# Patient Record
Sex: Female | Born: 1975
Health system: Southern US, Community
[De-identification: ages and names within clinical notes are randomized; demographics above are authoritative.]

## PROBLEM LIST (undated history)

## (undated) DIAGNOSIS — N920 Excessive and frequent menstruation with regular cycle: Secondary | ICD-10-CM

## (undated) DIAGNOSIS — E119 Type 2 diabetes mellitus without complications: Secondary | ICD-10-CM

## (undated) HISTORY — PX: TONSILLECTOMY: SUR1361

## (undated) HISTORY — DX: Excessive and frequent menstruation with regular cycle: N92.0

## (undated) HISTORY — PX: TUBAL LIGATION: SHX77

---

## 1998-04-29 ENCOUNTER — Emergency Department (HOSPITAL_COMMUNITY): Admission: EM | Admit: 1998-04-29 | Discharge: 1998-04-29 | Payer: Self-pay | Admitting: Emergency Medicine

## 1998-05-02 ENCOUNTER — Emergency Department (HOSPITAL_COMMUNITY): Admission: EM | Admit: 1998-05-02 | Discharge: 1998-05-03 | Payer: Self-pay | Admitting: Emergency Medicine

## 2001-05-05 ENCOUNTER — Inpatient Hospital Stay (HOSPITAL_COMMUNITY): Admission: AD | Admit: 2001-05-05 | Discharge: 2001-05-05 | Payer: Self-pay | Admitting: Obstetrics & Gynecology

## 2001-05-06 ENCOUNTER — Inpatient Hospital Stay (HOSPITAL_COMMUNITY): Admission: AD | Admit: 2001-05-06 | Discharge: 2001-05-06 | Payer: Self-pay | Admitting: *Deleted

## 2001-05-06 ENCOUNTER — Encounter: Payer: Self-pay | Admitting: *Deleted

## 2001-06-01 ENCOUNTER — Ambulatory Visit (HOSPITAL_COMMUNITY): Admission: RE | Admit: 2001-06-01 | Discharge: 2001-06-01 | Payer: Self-pay | Admitting: *Deleted

## 2001-06-02 ENCOUNTER — Inpatient Hospital Stay (HOSPITAL_COMMUNITY): Admission: AD | Admit: 2001-06-02 | Discharge: 2001-06-02 | Payer: Self-pay | Admitting: Obstetrics & Gynecology

## 2001-09-05 ENCOUNTER — Inpatient Hospital Stay (HOSPITAL_COMMUNITY): Admission: AD | Admit: 2001-09-05 | Discharge: 2001-09-05 | Payer: Self-pay | Admitting: *Deleted

## 2001-10-05 ENCOUNTER — Inpatient Hospital Stay (HOSPITAL_COMMUNITY): Admission: AD | Admit: 2001-10-05 | Discharge: 2001-10-05 | Payer: Self-pay | Admitting: Obstetrics and Gynecology

## 2001-10-12 ENCOUNTER — Inpatient Hospital Stay (HOSPITAL_COMMUNITY): Admission: AD | Admit: 2001-10-12 | Discharge: 2001-10-15 | Payer: Self-pay | Admitting: Obstetrics and Gynecology

## 2001-10-12 ENCOUNTER — Encounter (HOSPITAL_COMMUNITY): Admission: RE | Admit: 2001-10-12 | Discharge: 2001-10-12 | Payer: Self-pay | Admitting: Obstetrics and Gynecology

## 2001-12-09 ENCOUNTER — Encounter: Admission: RE | Admit: 2001-12-09 | Discharge: 2001-12-09 | Payer: Self-pay | Admitting: *Deleted

## 2001-12-19 ENCOUNTER — Ambulatory Visit (HOSPITAL_COMMUNITY): Admission: RE | Admit: 2001-12-19 | Discharge: 2001-12-19 | Payer: Self-pay | Admitting: *Deleted

## 2002-08-04 ENCOUNTER — Encounter: Admission: RE | Admit: 2002-08-04 | Discharge: 2002-08-04 | Payer: Self-pay | Admitting: *Deleted

## 2002-11-21 ENCOUNTER — Emergency Department (HOSPITAL_COMMUNITY): Admission: EM | Admit: 2002-11-21 | Discharge: 2002-11-22 | Payer: Self-pay | Admitting: Emergency Medicine

## 2003-10-03 ENCOUNTER — Emergency Department (HOSPITAL_COMMUNITY): Admission: AD | Admit: 2003-10-03 | Discharge: 2003-10-03 | Payer: Self-pay | Admitting: Family Medicine

## 2003-10-10 ENCOUNTER — Emergency Department (HOSPITAL_COMMUNITY): Admission: EM | Admit: 2003-10-10 | Discharge: 2003-10-10 | Payer: Self-pay | Admitting: Emergency Medicine

## 2003-11-22 ENCOUNTER — Emergency Department (HOSPITAL_COMMUNITY): Admission: EM | Admit: 2003-11-22 | Discharge: 2003-11-22 | Payer: Self-pay | Admitting: Family Medicine

## 2005-02-05 ENCOUNTER — Emergency Department (HOSPITAL_COMMUNITY): Admission: EM | Admit: 2005-02-05 | Discharge: 2005-02-06 | Payer: Self-pay | Admitting: Emergency Medicine

## 2007-02-12 ENCOUNTER — Emergency Department (HOSPITAL_COMMUNITY): Admission: EM | Admit: 2007-02-12 | Discharge: 2007-02-12 | Payer: Self-pay | Admitting: Family Medicine

## 2007-02-21 ENCOUNTER — Emergency Department (HOSPITAL_COMMUNITY): Admission: EM | Admit: 2007-02-21 | Discharge: 2007-02-21 | Payer: Self-pay | Admitting: Emergency Medicine

## 2007-02-24 ENCOUNTER — Emergency Department (HOSPITAL_COMMUNITY): Admission: EM | Admit: 2007-02-24 | Discharge: 2007-02-24 | Payer: Self-pay | Admitting: Emergency Medicine

## 2007-02-27 ENCOUNTER — Emergency Department (HOSPITAL_COMMUNITY): Admission: EM | Admit: 2007-02-27 | Discharge: 2007-02-28 | Payer: Self-pay | Admitting: Emergency Medicine

## 2007-03-03 ENCOUNTER — Emergency Department (HOSPITAL_COMMUNITY): Admission: EM | Admit: 2007-03-03 | Discharge: 2007-03-04 | Payer: Self-pay | Admitting: Emergency Medicine

## 2007-03-07 ENCOUNTER — Emergency Department (HOSPITAL_COMMUNITY): Admission: EM | Admit: 2007-03-07 | Discharge: 2007-03-07 | Payer: Self-pay | Admitting: Emergency Medicine

## 2007-03-09 ENCOUNTER — Emergency Department (HOSPITAL_COMMUNITY): Admission: EM | Admit: 2007-03-09 | Discharge: 2007-03-09 | Payer: Self-pay | Admitting: Emergency Medicine

## 2008-07-16 IMAGING — CR DG CHEST 2V
2 series · 2 of 2 positions shown · non-contrast
Comparison: 10/10/03.

CLINICAL DATA: 31-year-old female with difficulty breathing, cough x 2 days, and dizziness. 
 CHEST ? 2 VIEW:

[w chest pa]
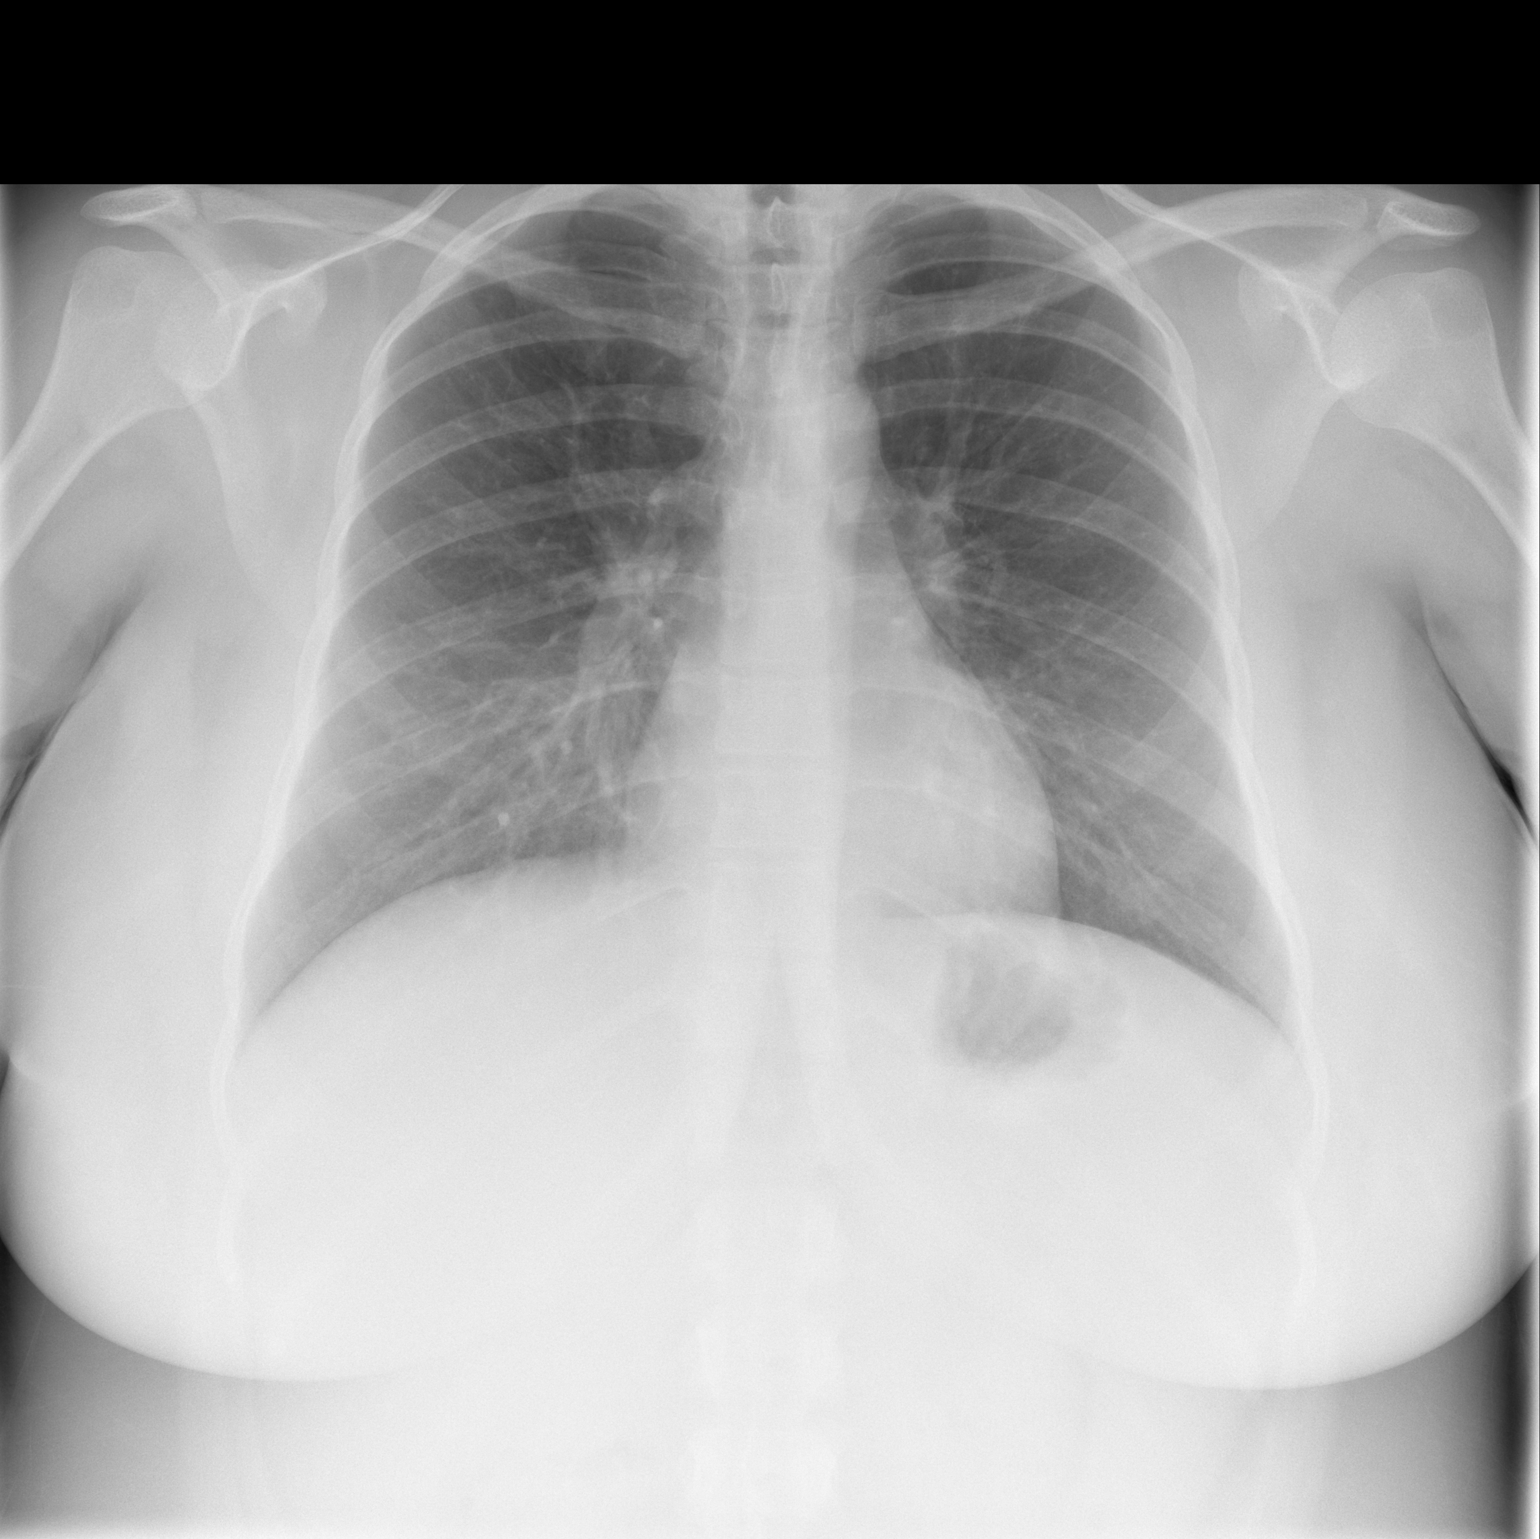

[w chest lat]
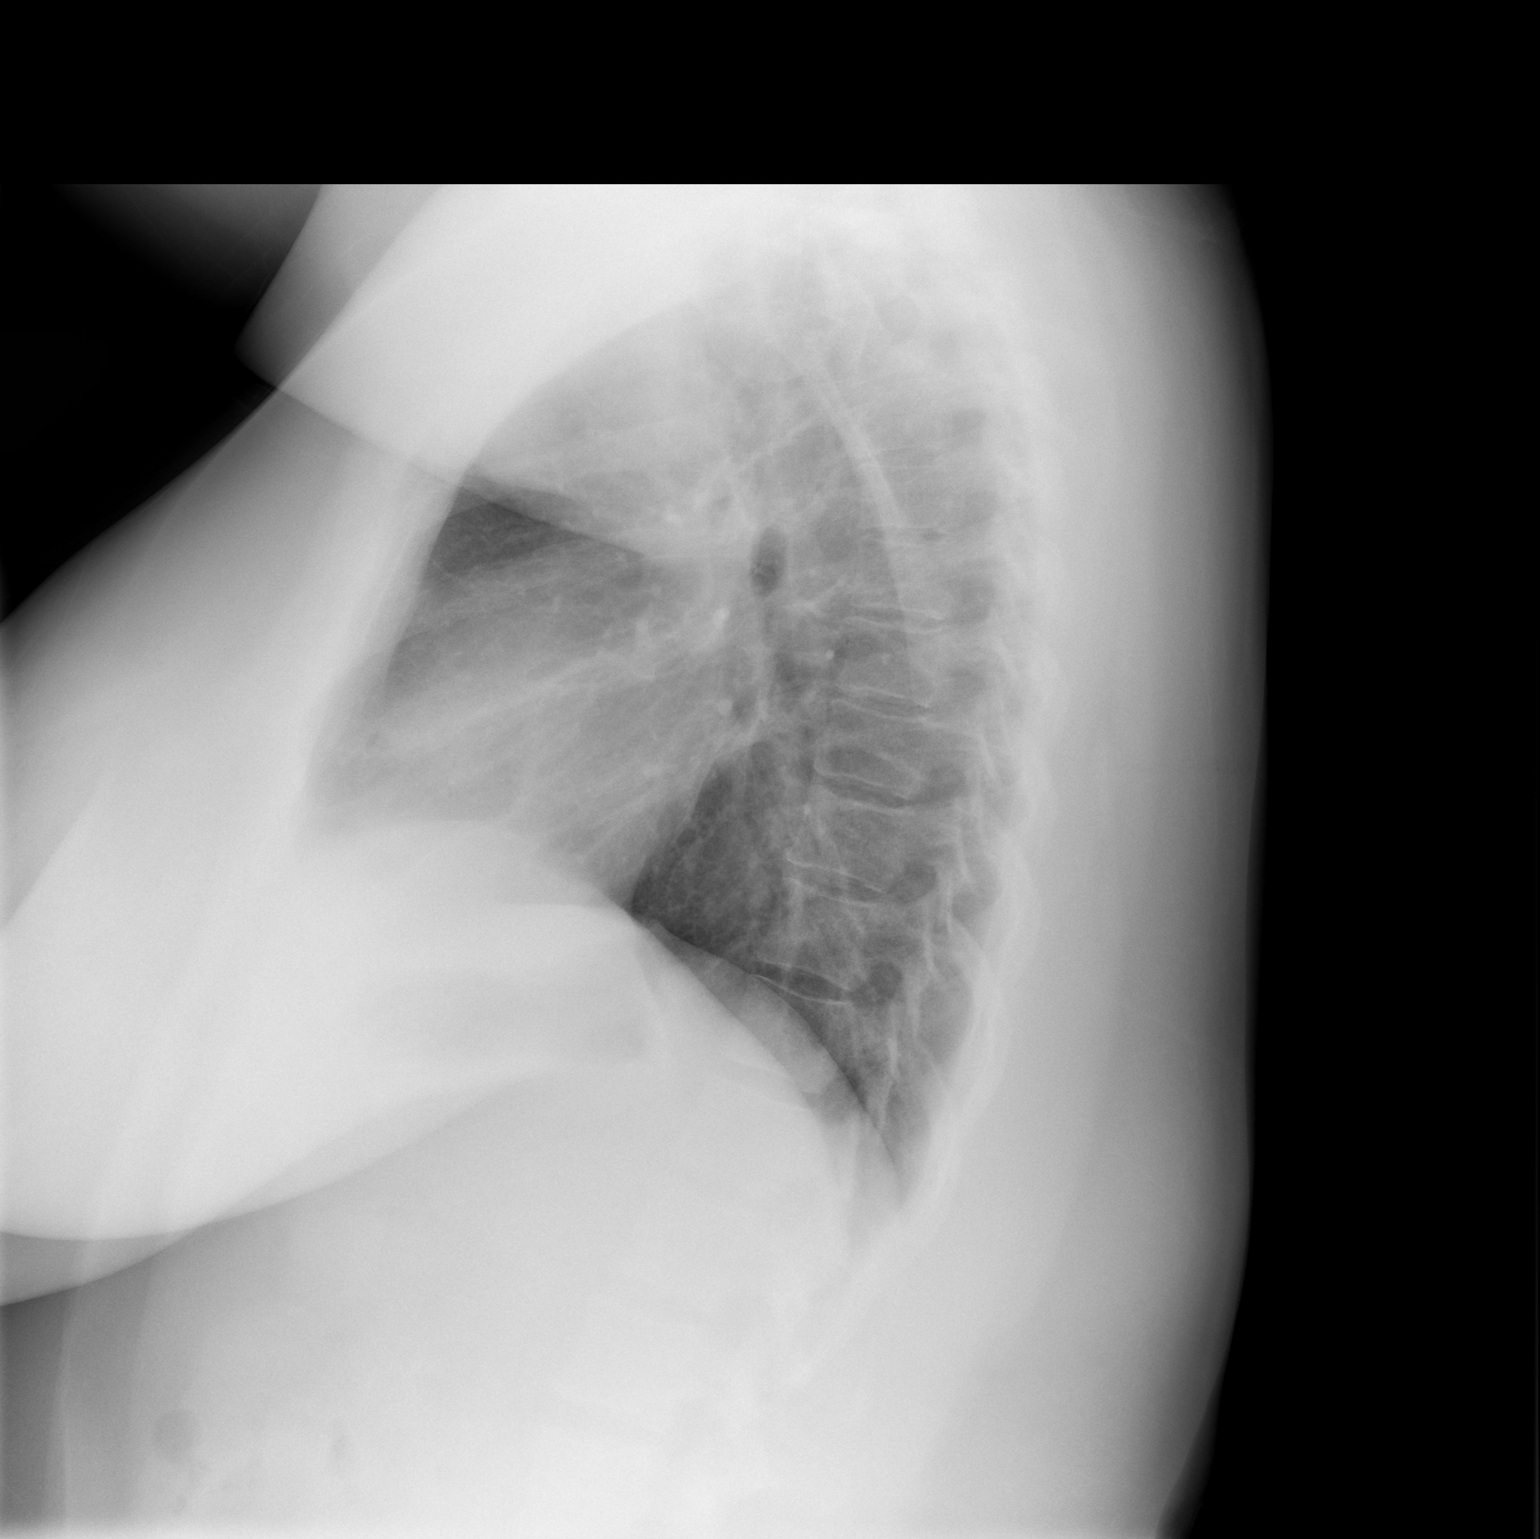

[2 of 2 positions shown; findings below may reference images not displayed]

FINDINGS: Slightly shallower lung inflation with crowding of markings at the lung bases.  Otherwise, the lungs are clear.  No pneumothorax or effusion.  Normal cardiac size, mediastinal contour, and osseous structures.
IMPRESSION: Slightly shallow lung inflation with crowding of lung markings at the bases ? no acute process.

## 2010-07-16 ENCOUNTER — Other Ambulatory Visit
Admission: RE | Admit: 2010-07-16 | Discharge: 2010-07-16 | Payer: Self-pay | Source: Home / Self Care | Admitting: Obstetrics and Gynecology

## 2010-07-16 ENCOUNTER — Ambulatory Visit
Admission: RE | Admit: 2010-07-16 | Discharge: 2010-07-16 | Payer: Self-pay | Source: Home / Self Care | Attending: Obstetrics and Gynecology | Admitting: Obstetrics and Gynecology

## 2010-07-16 LAB — POCT PREGNANCY, URINE: Preg Test, Ur: NEGATIVE

## 2010-07-30 ENCOUNTER — Ambulatory Visit: Admit: 2010-07-30 | Payer: Self-pay | Admitting: Obstetrics & Gynecology

## 2010-08-11 ENCOUNTER — Ambulatory Visit: Admit: 2010-08-11 | Payer: Self-pay | Admitting: Obstetrics & Gynecology

## 2010-08-20 ENCOUNTER — Ambulatory Visit (INDEPENDENT_AMBULATORY_CARE_PROVIDER_SITE_OTHER): Payer: Self-pay | Admitting: Obstetrics and Gynecology

## 2010-08-20 DIAGNOSIS — N949 Unspecified condition associated with female genital organs and menstrual cycle: Secondary | ICD-10-CM

## 2010-09-18 ENCOUNTER — Ambulatory Visit: Payer: Self-pay | Admitting: Obstetrics and Gynecology

## 2010-10-03 NOTE — Progress Notes (Unsigned)
NAME:  Melissa Pratt, Melissa Pratt NO.:  [id number]  MEDICAL RECORD NO.:  [id number]           PATIENT TYPE:  A  LOCATION:  [facility]                   FACILITY:  [facility]  PHYSICIAN:  Argentina Donovan, MD        DATE OF BIRTH:  [birth year]  DATE OF SERVICE:  08/20/2010                                 CLINIC NOTE  The patient is a 35 year old African American female gravida 3, para 3-0- 0-3, who in the beginning of January had an IUD removed.  She had been spotting almost continually with the past several years since the IUD was put in and we switched her to Loestrin birth control pills.  She has been taking that now over a month and is still spotting.  We are going to continue one more month on this.  She is filling out the papers for assistance.  If it continues, we will have someone see her and schedule for an ablation.  I have given her some information on the ablation and answered questions about the procedure.  She had an endometrial biopsy, which was normal.  It did show some endocervical type polyps.  IMPRESSION:  Dysfunctional uterine bleeding of unknown etiology.          ______________________________ Argentina Donovan, MD    PR/MEDQ  D:  08/20/2010  T:  08/21/2010  Job:  [id number]

## 2010-11-28 NOTE — Op Note (Signed)
[facility]  Patient:    Melissa Pratt, Melissa Pratt Visit Number: [id number] MRN: [id number]          Service Type: DSU Location: [facility] Attending Physician:  Enid Cutter Dictated by:   Shearon Balo, M.D. Proc. Date: 12/19/01 Admit Date:  12/19/2001 Discharge Date: 12/19/2001                             Operative Report  PREOPERATIVE DIAGNOSIS:       A 35 year old G3 P3 black female with desired fertility.  POSTOPERATIVE DIAGNOSIS:      A 35 year old G3 P3 black female with desired fertility.  OPERATION:                    Laparoscopic bilateral tubal ligation with Falope ring.  SURGEON:                      Shearon Balo, M.D.  ANESTHESIA:                   General endotracheal.  COMPLICATIONS:                None.  ESTIMATED BLOOD LOSS:         Minimal.  SPECIMENS:                    None.  FINDINGS:                     Normal uterus, tubes, and ovaries, appendix, and liver.  DISPOSITION:                  Recovery room, stable.  DESCRIPTION OF PROCEDURE:     The patient taken to the operating room where she was placed under general endotracheal anesthesia. She was placed in stirrups and prepped and draped in sterile fashion. Acorn cannula was placed in the cervix and attached to the tenaculum at the cervix. The attention was then turned to the abdomen where the umbilicus was injected with 0.25% Marcaine. An incision was made at the umbilicus and a Verres needle was introduced. The abdomen was insufflated to 3.5 L of CO2 gas. A 5 mm trocar was introduced through the incision and a 5 mm scope was used to visualize the pelvic anatomy. A second incision was made suprapubically after injecting Marcaine. A 9 mm trocar was introduced. The uterus, tubes, and ovaries were visualized and appeared to be normal. The patients right fallopian tube was grasped after identifying the fimbriated end. The Falope ring was applied and appropriate knuckle of tube  was obtained with a good slip and blanching of the tube. The patients left fallopian tube was identified in similar fashion. The Falope ring was applied and again, a good knuckle of tube with appropriate slip and blanch were obtained. The tubes were noted to be hemostatic. The suprapubic trocar was removed under direct visualization and the abdomen was approximated and CO2 was evacuated from the abdominal cavity. The umbilical trocar was removed without difficulty. The incisions were then reapproximated with Dermabond. The instruments were removed from the vagina and the patients cervix was noted to have some small oozing and silver nitrate was applied. The patient tolerated the procedure well and returned to the recovery room in stable condition. Dictated by:   Shearon Balo, M.D. Attending Physician:  Enid Cutter DD:  12/19/01 TD:  12/20/01 Job:  [id number]

## 2010-11-28 NOTE — H&P (Signed)
[facility]  Patient:    Melissa Pratt, Melissa Pratt Visit Number: [id number] MRN: [id number]          Service Type: OBS Location: 910A [id number] [id number] Attending Physician:  Amada Kingfisher. Dictated by:   Shearon Balo, M.D. Admit Date:  10/12/2001 Discharge Date: 10/15/2001                           History and Physical  HISTORY OF PRESENT ILLNESS:   The patient is a 35 year old gravida 3 para 3 black female with undesired fertility.  The patient has been counselled extensively regarding the risks of bilateral tubal ligation including bleeding, infection, injury to internal organs, the risk of failure, and the increased risk of ectopic gestation.  In addition, she has been counselled regarding the permanence of this procedure and declines alternative forms of birth control including IUD, hormonal, and barrier contraception.  PAST MEDICAL HISTORY:         None.  PAST SURGICAL HISTORY:        T&A.  PAST OBSTETRICAL HISTORY:     Spontaneous vaginal delivery x3.  PAST GYNECOLOGICAL HISTORY:   No history of sexually transmitted diseases or abnormal Pap smears.  MEDICATIONS:                  None.  ALLERGIES:                    None.  SOCIAL HISTORY:               She denies tobacco, alcohol, or drug use.  FAMILY HISTORY:               Not significant.  PHYSICAL EXAMINATION:  HEENT:                        Normocephalic, atraumatic.  HEART:                        Regular rate and rhythm without murmurs, rubs, or gallops.  LUNGS:                        Clear to auscultation bilaterally.  ABDOMEN:                      Soft, nondistended, no hepatosplenomegaly noted.  EXTREMITIES:                  Within normal limits.  PELVIC:                       Bimanual exam reveals an 8-10 cm retroverted uterus that is nontender and regular in shape.  No adnexal masses noted.  ASSESSMENT AND PLAN:          The patient is a 35 year old G3 P3 black female status  post spontaneous vaginal delivery approximately two months.   The patient desires permanent sterilization.  She will be scheduled for laparoscopy bilateral tubal ligation with Falope rings for December 19, 2001. Dictated by:   Shearon Balo, M.D. Attending Physician:  Amada Kingfisher. DD:  12/09/01 TD:  12/09/01 Job: [id number]

## 2011-04-24 LAB — CBC
HCT: 36.8
HCT: 36.8
Hemoglobin: 12.7
Hemoglobin: 12.7
MCHC: 34.7
MCHC: 34.4
MCV: 85.6
MCV: 85.7
Platelets: 319
Platelets: 319
RBC: 4.29
RBC: 4.3
RDW: 12.7
RDW: 12.5
WBC: 11.1 — ABNORMAL HIGH
WBC: 9.5

## 2011-04-24 LAB — PREGNANCY, URINE: Preg Test, Ur: NEGATIVE

## 2011-04-24 LAB — DIFFERENTIAL
Basophils Absolute: 0
Basophils Absolute: 0
Basophils Relative: 0
Basophils Relative: 0
Eosinophils Absolute: 0.2
Eosinophils Absolute: 0.1
Eosinophils Relative: 2
Eosinophils Relative: 1
Lymphocytes Relative: 29
Lymphocytes Relative: 20
Lymphs Abs: 3.3
Lymphs Abs: 1.9
Monocytes Absolute: 0.5
Monocytes Absolute: 0.5
Monocytes Relative: 4
Monocytes Relative: 6
Neutro Abs: 7.2
Neutro Abs: 7.1
Neutrophils Relative %: 64
Neutrophils Relative %: 74

## 2011-04-24 LAB — I-STAT 8, (EC8 V) (CONVERTED LAB)
Acid-base deficit: 2
BUN: 3 — ABNORMAL LOW
BUN: 6
Bicarbonate: 22.2
Bicarbonate: 23.1
Chloride: 105
Chloride: 106
Glucose, Bld: 108 — ABNORMAL HIGH
Glucose, Bld: 132 — ABNORMAL HIGH
HCT: 39
HCT: 42
Hemoglobin: 13.3
Hemoglobin: 14.3
Operator id: 235561
Operator id: 282201
Potassium: 3.2 — ABNORMAL LOW
Potassium: 3.9
Sodium: 138
Sodium: 139
TCO2: 23
TCO2: 24
pCO2, Ven: 32.7 — ABNORMAL LOW
pCO2, Ven: 33.9 — ABNORMAL LOW
pH, Ven: 7.424 — ABNORMAL HIGH
pH, Ven: 7.458 — ABNORMAL HIGH

## 2011-04-24 LAB — POCT URINALYSIS DIP (DEVICE)
Bilirubin Urine: NEGATIVE
Glucose, UA: NEGATIVE
Hgb urine dipstick: NEGATIVE
Ketones, ur: NEGATIVE
Nitrite: NEGATIVE
Operator id: 235561
Protein, ur: NEGATIVE
Specific Gravity, Urine: <=1.005
Urobilinogen, UA: 0.2
pH: 6

## 2011-04-24 LAB — URINALYSIS, ROUTINE W REFLEX MICROSCOPIC
Bilirubin Urine: NEGATIVE
Bilirubin Urine: NEGATIVE
Glucose, UA: NEGATIVE
Glucose, UA: NEGATIVE
Hgb urine dipstick: NEGATIVE
Hgb urine dipstick: NEGATIVE
Ketones, ur: NEGATIVE
Ketones, ur: NEGATIVE
Nitrite: NEGATIVE
Nitrite: NEGATIVE
Protein, ur: NEGATIVE
Protein, ur: NEGATIVE
Specific Gravity, Urine: 1.002 — ABNORMAL LOW
Specific Gravity, Urine: 1.006
Urobilinogen, UA: 0.2
Urobilinogen, UA: 0.2
pH: 7
pH: 7

## 2011-04-24 LAB — BASIC METABOLIC PANEL
BUN: 3 — ABNORMAL LOW
CO2: 23
Calcium: 9.2
Chloride: 109
Creatinine, Ser: 0.74
GFR calc Af Amer: >60
GFR calc non Af Amer: >60
Glucose, Bld: 111 — ABNORMAL HIGH
Potassium: 3.6
Sodium: 138

## 2011-04-24 LAB — POCT I-STAT CREATININE
Creatinine, Ser: 0.7
Operator id: 282201

## 2011-04-24 LAB — POCT PREGNANCY, URINE
Operator id: 285841
Preg Test, Ur: NEGATIVE

## 2011-05-20 ENCOUNTER — Encounter: Payer: Self-pay | Admitting: *Deleted

## 2011-05-20 ENCOUNTER — Emergency Department (INDEPENDENT_AMBULATORY_CARE_PROVIDER_SITE_OTHER)
Admission: EM | Admit: 2011-05-20 | Discharge: 2011-05-20 | Disposition: A | Discharge status: Home / Self Care | Payer: Self-pay | Source: Home / Self Care | Attending: Family Medicine | Admitting: Family Medicine

## 2011-05-20 DIAGNOSIS — M7650 Patellar tendinitis, unspecified knee: Secondary | ICD-10-CM

## 2011-05-20 MED ORDER — IBUPROFEN 800 MG PO TABS: 800.0000 mg | ORAL_TABLET | Freq: Three times a day (TID) | ORAL | Status: AC

## 2011-05-20 NOTE — ED Provider Notes (Signed)
History     CSN: [id number] Arrival date & time: 05/20/2011  9:17 PM   First MD Initiated Contact with Patient 05/20/11 2120      Chief Complaint  Patient presents with  . Knee Pain    onset of left knee pain yesterday no known injury    (Consider location/radiation/quality/duration/timing/severity/associated sxs/prior treatment) Patient is a 35 y.o. female presenting with knee pain. The history is provided by the patient.  Knee Pain The current episode started yesterday. The problem occurs constantly. The problem has not changed since onset.The symptoms are aggravated by walking (worse with stairs ). The symptoms are relieved by position.    History reviewed. No pertinent past medical history.  History reviewed. No pertinent past surgical history.  History reviewed. No pertinent family history.  History  Substance Use Topics  . Smoking status: Not on file  . Smokeless tobacco: Not on file  . Alcohol Use: Not on file    OB History    Grav Para Term Preterm Abortions TAB SAB Ect Mult Living                  Review of Systems  Constitutional: Negative.   Musculoskeletal: Negative for joint swelling and gait problem.    Allergies  Review of patient's allergies indicates no known allergies.  Home Medications   Current Outpatient Rx  Name Route Sig Dispense Refill  . IBUPROFEN 400 MG PO TABS Oral Take 400 mg by mouth every 6 (six) hours as needed.        BP 123/86  Pulse 80  Temp(Src) 97.6 F (36.4 C) (Oral)  Resp 15  Physical Exam  Constitutional: She appears well-developed and well-nourished.  Musculoskeletal: Normal range of motion. She exhibits tenderness.       Left knee: She exhibits no swelling, no effusion and no bony tenderness. tenderness found. Patellar tendon tenderness noted.  Skin: Skin is warm, dry and intact.       ED Course  Procedures (including critical care time)  Labs Reviewed - No data to display No results found.   No  diagnosis found.    MDM          Barkley Bruns, MD 05/20/11 [phone]

## 2011-10-22 ENCOUNTER — Ambulatory Visit (INDEPENDENT_AMBULATORY_CARE_PROVIDER_SITE_OTHER): Payer: Self-pay | Admitting: Physician Assistant

## 2011-10-22 ENCOUNTER — Encounter: Payer: Self-pay | Admitting: Physician Assistant

## 2011-10-22 VITALS — BP 137/93 | HR 104 | Temp 98.0°F | Ht 63.0 in | Wt 223.2 lb

## 2011-10-22 DIAGNOSIS — N939 Abnormal uterine and vaginal bleeding, unspecified: Secondary | ICD-10-CM

## 2011-10-22 DIAGNOSIS — N938 Other specified abnormal uterine and vaginal bleeding: Secondary | ICD-10-CM | POA: Insufficient documentation

## 2011-10-22 DIAGNOSIS — Z01812 Encounter for preprocedural laboratory examination: Secondary | ICD-10-CM

## 2011-10-22 LAB — POCT PREGNANCY, URINE: Preg Test, Ur: NEGATIVE

## 2011-10-22 MED ORDER — MEGESTROL ACETATE 40 MG PO TABS: 40.0000 mg | ORAL_TABLET | Freq: Every day | ORAL | Status: AC

## 2011-10-22 NOTE — Progress Notes (Signed)
Chief Complaint:  Menorrhagia and Fatigue   Melissa Pratt is  36 y.o. G3P3000.  Patient's last menstrual period was 10/09/2011..  She presents complaining of Menorrhagia and Fatigue . Onset is described as ongoing and has been present for  1 years. Pt reports long standing history of heavy periods. Reports that she was previously treated with and IUD that was in place x 5 years. While in had heavy painful periods lasting 7 days with clots. Her IUD was removed in 2012 by Dr. Okey Dupre with the plan of ablation for long-term management. Pt states that she never returned. Reports that since removal of IUD, she has been having period like bleeding every two weeks lasting 5-7 days. Last bleeding started as spotting on 10/09/11 and progressed to heavy bleeding last Wednesday that required changing a saturated super tampon every 2 hours, less today. Reports changing q 3 hours and tampon "half full". Also reports extremely fatigue progressing over the last 2-3 months. Denies SOB, CP, or dizziness  Obstetrical/Gynecological History: Pertinent Gynecological History: Menses: see HPI Bleeding: dysfunctional uterine bleeding Contraception: none Last mammogram: N/A  Last pap: normal Date: 07/16/2010     Past Medical History: Past Medical History  Diagnosis Date  . Menorrhagia     Past Surgical History: Past Surgical History  Procedure Date  . Tubal ligation     Family History: History reviewed. No pertinent family history.  Social History: History  Substance Use Topics  . Smoking status: Never Smoker   . Smokeless tobacco: Never Used  . Alcohol Use: No    Allergies: No Known Allergies   Review of Systems - Negative except what has been reviewed in the HPI  Physical Exam   Blood pressure 137/93, pulse 104, temperature 98 F (36.7 C), temperature source Oral, height 5\' 3"  (1.6 m), weight 223 lb 3.2 oz (101.243 kg), last menstrual period 10/09/2011.  General: General appearance -  alert, well appearing, and in no distress, oriented to person, place, and time and overweight Mental status - alert, oriented to person, place, and time, normal mood, behavior, speech, dress, motor activity, and thought processes, affect appropriate to mood Abdomen - soft, nontender, nondistended, no masses or organomegaly obese Focused Gynecological Exam: VULVA: normal appearing vulva with no masses, tenderness or lesions, VAGINA: vaginal discharge - small pink, CERVIX: normal appearing cervix without discharge or lesions, UTERUS: enlarged to 12 week's size, ADNEXA: normal adnexa in size, nontender and no masses  Labs: Recent Results (from the past 24 hour(s))  POCT PREGNANCY, URINE   Collection Time   10/22/11  2:38 PM      Component Value Range   Preg Test, Ur NEGATIVE  NEGATIVE    FINAL DIAGNOSIS 2012  1. Endometrium, biopsy, : - PROLIFERATIVE TYPE ENDOMETRIUM. - ENDOCERVICAL TYPE POLYP(S). - THERE IS NO EVIDENCE OF HYPERPLASIA OR MALIGNANCY.     Assessment: 1. Pre-procedure lab exam    2. DUB (dysfunctional uterine bleeding)  CBC, US Pelvis Complete, US Transvaginal Non-OB, megestrol (MEGACE) 40 MG tablet     Plan: FTC in 2-3 weeks for surgical consult probable hystroscopic D&C with ablation.  Carly Applegate E. 10/22/2011,3:31 PM

## 2011-10-22 NOTE — Patient Instructions (Signed)

## 2011-10-22 NOTE — Progress Notes (Signed)
States here because had a heavy period for the first time- it started 10/09/11 and is still going on, at first it was light, usually lasts a week, but this time is still going on, then 09/18/11 got heavy until 09/20/11 and since then is light again. States feels really drained, no energy, just wants to lie down for a couple of days.

## 2011-10-23 LAB — CBC
HCT: 38.7 % (ref 36.0–46.0)
Hemoglobin: 13 g/dL (ref 12.0–15.0)
MCH: 28.8 pg (ref 26.0–34.0)
MCHC: 33.6 g/dL (ref 30.0–36.0)
MCV: 85.8 fL (ref 78.0–100.0)
Platelets: 408 10*3/uL — ABNORMAL HIGH (ref 150–400)
RBC: 4.51 MIL/uL (ref 3.87–5.11)
RDW: 13 % (ref 11.5–15.5)
WBC: 11.2 10*3/uL — ABNORMAL HIGH (ref 4.0–10.5)

## 2011-10-26 ENCOUNTER — Telehealth: Payer: Self-pay | Admitting: *Deleted

## 2011-10-26 ENCOUNTER — Encounter (HOSPITAL_COMMUNITY): Payer: Self-pay

## 2011-10-26 ENCOUNTER — Emergency Department (HOSPITAL_COMMUNITY)
Admission: EM | Admit: 2011-10-26 | Discharge: 2011-10-26 | Disposition: A | Discharge status: Home / Self Care | Payer: Self-pay | Source: Home / Self Care

## 2011-10-26 DIAGNOSIS — R5381 Other malaise: Secondary | ICD-10-CM

## 2011-10-26 DIAGNOSIS — R002 Palpitations: Secondary | ICD-10-CM

## 2011-10-26 DIAGNOSIS — M545 Low back pain: Secondary | ICD-10-CM

## 2011-10-26 DIAGNOSIS — R5383 Other fatigue: Secondary | ICD-10-CM

## 2011-10-26 DIAGNOSIS — K089 Disorder of teeth and supporting structures, unspecified: Secondary | ICD-10-CM

## 2011-10-26 DIAGNOSIS — K0889 Other specified disorders of teeth and supporting structures: Secondary | ICD-10-CM

## 2011-10-26 MED ORDER — METHOCARBAMOL 750 MG PO TABS: 750.0000 mg | ORAL_TABLET | Freq: Four times a day (QID) | ORAL | Status: AC

## 2011-10-26 MED ORDER — IBUPROFEN 800 MG PO TABS: 800.0000 mg | ORAL_TABLET | Freq: Three times a day (TID) | ORAL | Status: DC | PRN

## 2011-10-26 NOTE — Telephone Encounter (Signed)
Pt would like call back in reference to lab work.

## 2011-10-26 NOTE — Telephone Encounter (Signed)
Pt notified of CBC results, will keep her followup on 11/27/11

## 2011-10-26 NOTE — ED Provider Notes (Signed)
History     CSN: [id number]  Arrival date & time 10/26/11  1135   None     Chief Complaint  Patient presents with  . Fatigue    (Consider location/radiation/quality/duration/timing/severity/associated sxs/prior treatment) HPI Comments: Patient presents today with multiple complaints. Her symptoms began when she had a menstrual period that lasted from March 29 to April 13. She was evaluated at [facility] Friday, April 12 and had a negative CBC. She states for the last 5 days she has had fatigue, weakness, and dizziness. The she admits that the symptoms have improved, though not completely resolved, since her vaginal bleeding has stopped. She states for the last 4 days that she has pain intermittently in the back of her head, neck and in her low back. She has also had a toothache for one week. She had palpitations in the evening a couple of nights last week, but has not noticed any in the last few days. No chest pain, or dyspnea. She denies fever, chills, or nasal congestion. She has had a "little cough."  [facility] to get [facility], and she called the [facility] today to schedule an appt but couldn't be seen until next month.   Past Medical History  Diagnosis Date  . Menorrhagia     Past Surgical History  Procedure Date  . Tubal ligation     History reviewed. No pertinent family history.  History  Substance Use Topics  . Smoking status: Never Smoker   . Smokeless tobacco: Never Used  . Alcohol Use: No    OB History    Grav Para Term Preterm Abortions TAB SAB Ect Mult Living   3 3 3              Review of Systems  Constitutional: Positive for fatigue. Negative for fever, chills and appetite change.  HENT: Negative for ear pain, congestion, sore throat and rhinorrhea.   Eyes: Negative for visual disturbance.  Respiratory: Negative for cough and shortness of breath.   Cardiovascular: Positive for palpitations. Negative for chest pain.    Gastrointestinal: Negative for nausea, vomiting and abdominal pain.  Genitourinary: Negative for dysuria, urgency and frequency.    Allergies  Review of patient's allergies indicates no known allergies.  Home Medications   Current Outpatient Rx  Name Route Sig Dispense Refill  . IBUPROFEN 800 MG PO TABS Oral Take 1 tablet (800 mg total) by mouth every 8 (eight) hours as needed. 15 tablet 0  . MEGESTROL ACETATE 40 MG PO TABS Oral Take 1 tablet (40 mg total) by mouth daily. 30 tablet 0  . METHOCARBAMOL 750 MG PO TABS Oral Take 1 tablet (750 mg total) by mouth 4 (four) times daily. 20 tablet 0    BP 127/78  Pulse 87  Temp(Src) 98.3 F (36.8 C) (Oral)  Resp 14  SpO2 98%  LMP 10/09/2011  Physical Exam  Nursing note and vitals reviewed. Constitutional: She appears well-developed and well-nourished. No distress.  HENT:  Head: Normocephalic and atraumatic.  Right Ear: Tympanic membrane, external ear and ear canal normal.  Left Ear: Tympanic membrane, external ear and ear canal normal.  Nose: Nose normal.  Mouth/Throat: Uvula is midline, oropharynx is clear and moist and mucous membranes are normal. Abnormal dentition. No dental abscesses. No oropharyngeal exudate, posterior oropharyngeal edema or posterior oropharyngeal erythema.    Eyes: Conjunctivae, EOM and lids are normal. Pupils are equal, round, and reactive to light.  Neck: Neck supple.  Cardiovascular:  Normal rate, regular rhythm and normal heart sounds.   Pulmonary/Chest: Effort normal and breath sounds normal. No respiratory distress.  Abdominal: Normal appearance and bowel sounds are normal. She exhibits no mass. There is no hepatosplenomegaly. There is no tenderness. There is no CVA tenderness.  Musculoskeletal:       Cervical back: She exhibits tenderness (TTP bilat paraspinal muscles from occiput to posterior shoulders/trapezius) and bony tenderness. She exhibits normal range of motion, no swelling, no deformity and  no spasm.       Lumbar back: She exhibits tenderness (TTP bilat paraspinal muscles). She exhibits normal range of motion, no bony tenderness, no swelling, no deformity and no spasm.  Lymphadenopathy:    She has no cervical adenopathy.  Neurological: She is alert.  Skin: Skin is warm and dry.  Psychiatric: She has a normal mood and affect.    ED Course  Procedures (including critical care time)  Labs Reviewed - No data to display No results found.   1. Low back pain   2. Pain, dental   3. Palpitations   4. Fatigue       MDM  CBC reviewed.  EKG NSR rate 76.          Melody Comas, [place] 10/26/11 1456

## 2011-10-26 NOTE — ED Notes (Signed)
C/o not felt well for 1 week; LMP started 3-29 and cont until 4-13; Wend, started to feel drained, tired; was seen at [facility] on Friday , and was informed the results did not show anything; has another apt done 5-17 to see MD, to have a uterine scraping done ; c/o back pain and HA, wants to be checked out

## 2011-10-26 NOTE — Discharge Instructions (Signed)
Follow up with a dentist regarding your broken tooth.  I am treating your muscular neck and back pain with Ibuprofen and a muscle relaxer.  Return if you have further episodes of palpitations for evaluation. Otherwise follow [facility] with [facility] as [facility]. Follow up with your OB/GYN regarding your menstrual problems.   Back Pain, Adult Back pain is very common. The pain often gets better over time. The cause of back pain is usually not dangerous. Most people can learn to manage their back pain on their own.  HOME CARE   Stay active. Start with short walks on flat ground if you can. Try to walk farther each day.   Do not sit, drive, or stand in one place for more than 30 minutes. Do not stay in bed.   Do not avoid exercise or work. Activity can help your back heal faster.   Be careful when you bend or lift an object. Bend at your knees, keep the object close to you, and do not twist.   Sleep on a firm mattress. Lie on your side, and bend your knees. If you lie on your back, put a pillow under your knees.   Only take medicines as told by your doctor.   Put ice on the injured area.   Put ice in a plastic bag.   Place a towel between your skin and the bag.   Leave the ice on for 15 to 20 minutes, 3 to 4 times a day for the first 2 to 3 days. After that, you can switch between ice and heat packs.   Ask your doctor about back exercises or massage.   Avoid feeling anxious or stressed. Find good ways to deal with stress, such as exercise.  GET HELP RIGHT AWAY IF:   Your pain does not go away with rest or medicine.   Your pain does not go away in 1 week.   You have new problems.   You do not feel well.   The pain spreads into your legs.   You cannot control when you poop (bowel movement) or pee (urinate).   Your arms or legs feel weak or lose feeling (numbness).   You feel sick to your stomach (nauseous) or throw up (vomit).   You have belly (abdominal) pain.     You feel like you may pass out (faint).  MAKE SURE YOU:   Understand these instructions.   Will watch your condition.   Will get help right away if you are not doing well or get worse.  Document Released: 12/16/2007 Document Revised: 06/18/2011 Document Reviewed: 11/17/2010 [facility] Patient Information 2012 [place], [place].

## 2011-10-31 NOTE — ED Provider Notes (Signed)
Medical screening examination/treatment/procedure(s) were performed by resident physician or non-physician practitioner and as supervising physician I was immediately available for consultation/collaboration.   KINDL,JAMES DOUGLAS MD.    James D Kindl, MD 10/31/11 1138 

## 2011-11-27 ENCOUNTER — Encounter: Payer: Self-pay | Admitting: Obstetrics & Gynecology

## 2011-11-27 ENCOUNTER — Ambulatory Visit (INDEPENDENT_AMBULATORY_CARE_PROVIDER_SITE_OTHER): Payer: Self-pay | Admitting: Obstetrics & Gynecology

## 2011-11-27 VITALS — BP 119/80 | HR 79 | Temp 97.9°F | Ht 63.0 in | Wt 219.6 lb

## 2011-11-27 DIAGNOSIS — N938 Other specified abnormal uterine and vaginal bleeding: Secondary | ICD-10-CM

## 2011-11-27 DIAGNOSIS — N926 Irregular menstruation, unspecified: Secondary | ICD-10-CM

## 2011-11-27 NOTE — Progress Notes (Signed)
Patient ID: Melissa Pratt, female   DOB: 05-01-1976, 36 y.o.   MRN: [id number] Patient's last menstrual period was 11/21/2011. Patient has had worsening menorrhagia since her Mirena was removed in January 2012. An endometrial biopsy in January 2012 was benign with some endocervical tie polyps. Dr. Okey Dupre had intended that she schedule and endometrial ablation in 2012 but this has not been performed. She comes today to schedule that procedure. She saw Maylon Cos last month. I explained the procedure and the risk of continued having bleeding. The rate of amenorrhea is approximately 50%. The risk of anesthesia bleeding complications infection were discussed. Her questions were answered. We'll schedule and endometrial ablation with [facility].  Dr. Scheryl Darter 11/27/2011 11:37 AM

## 2011-11-27 NOTE — Patient Instructions (Signed)
Endometrial Ablation Endometrial ablation removes the lining of the uterus (endometrium). It is usually a same day, outpatient treatment. Ablation helps avoid major surgery (such as a hysterectomy). A hysterectomy is removal of the cervix and uterus. Endometrial ablation has less risk and complications, has a shorter recovery period and is less expensive. After endometrial ablation, most women will have little or no menstrual bleeding. You may not keep your fertility. Pregnancy is no longer likely after this procedure but if you are pre-menopausal, you still need to use a reliable method of birth control following the procedure because pregnancy can occur. REASONS TO HAVE THE PROCEDURE MAY INCLUDE:  Heavy periods.   Bleeding that is causing anemia.   Anovulatory bleeding, very irregular, bleeding.   Bleeding submucous fibroids (on the lining inside the uterus) if they are smaller than 3 centimeters.  REASONS NOT TO HAVE THE PROCEDURE MAY INCLUDE:  You wish to have more children.   You have a pre-cancerous or cancerous problem. The cause of any abnormal bleeding must be diagnosed before having the procedure.   You have pain coming from the uterus.   You have a submucus fibroid larger than 3 centimeters.   You recently had a baby.   You recently had an infection in the uterus.   You have a severe retro-flexed, tipped uterus and cannot insert the instrument to do the ablation.   You had a Cesarean section or deep major surgery on the uterus.   The inner cavity of the uterus is too large for the endometrial ablation instrument.  RISKS AND COMPLICATIONS   Perforation of the uterus.   Bleeding.   Infection of the uterus, bladder or vagina.   Injury to surrounding organs.   Cutting the cervix.   An air bubble to the lung (air embolus).   Pregnancy following the procedure.   Failure of the procedure to help the problem requiring hysterectomy.   Decreased ability to diagnose  cancer in the lining of the uterus.  BEFORE THE PROCEDURE  The lining of the uterus must be tested to make sure there is no pre-cancerous or cancer cells present.   Medications may be given to make the lining of the uterus thinner.   Ultrasound may be used to evaluate the size and look for abnormalities of the uterus.   Future pregnancy is not desired.  PROCEDURE  There are different ways to destroy the lining of the uterus.   Resectoscope - radio frequency-alternating electric current is the most common one used.   Cryotherapy - freezing the lining of the uterus.   Heated Free Liquid - heated salt (saline) solution inserted into the uterus.   Microwave - uses high energy microwaves in the uterus.   Thermal Balloon - a catheter with a balloon tip is inserted into the uterus and filled with heated fluid.  Your caregiver will talk with you about the method used in this clinic. They will also instruct you on the pros and cons of the procedure. Endometrial ablation is performed along with a procedure called operative hysteroscopy. A narrow viewing tube is inserted through the birth canal (vagina) and through the cervix into the uterus. A tiny camera attached to the viewing tube (hysteroscope) allows the uterine cavity to be shown on a TV monitor during surgery. Your uterus is filled with a harmless liquid to make the procedure easier. The lining of the uterus is then removed. The lining can also be removed with a resectoscope which allows your surgeon   to cut away the lining of the uterus under direct vision. Usually, you will be able to go home within an hour after the procedure. HOME CARE INSTRUCTIONS   Do not drive for 24 hours.   No tampons, douching or intercourse for 2 weeks or until your caregiver approves.   Rest at home for 24 to 48 hours. You may then resume normal activities unless told differently by your caregiver.   Take your temperature two times a day for 4 days, and record  it.   Take any medications your caregiver has ordered, as directed.   Use some form of contraception if you are pre-menopausal and do not want to get pregnant.  Bleeding after the procedure is normal. It varies from light spotting and mildly watery to bloody discharge for 4 to 6 weeks. You may also have mild cramping. Only take over-the-counter or prescription medicines for pain, discomfort, or fever as directed by your caregiver. Do not use aspirin, as this may aggravate bleeding. Frequent urination during the first 24 hours is normal. You will not know how effective your surgery is until at least 3 months after the surgery. SEEK IMMEDIATE MEDICAL CARE IF:   Bleeding is heavier than a normal menstrual cycle.   An oral temperature above 102 F (38.9 C) develops.   You have increasing cramps or pains not relieved with medication or develop belly (abdominal) pain which does not seem to be related to the same area of earlier cramping and pain.   You are light headed, weak or have fainting episodes.   You develop pain in the shoulder strap areas.   You have chest or leg pain.   You have abnormal vaginal discharge.   You have painful urination.  Document Released: 05/08/2004 Document Revised: 06/18/2011 Document Reviewed: 08/06/2007 [employer] Patient Information 2012 [employer]. 

## 2011-12-14 ENCOUNTER — Encounter (HOSPITAL_COMMUNITY): Payer: Self-pay | Admitting: *Deleted

## 2011-12-16 ENCOUNTER — Telehealth: Payer: Self-pay

## 2011-12-16 NOTE — Telephone Encounter (Signed)
Called pt and pt stated that she had fibroids and is having pain.  Pt stated is this the reason why she is having pain due to the fibroids.  I advised pt that yes due to her having fibroids it could cause abdominal pain and the reasoning for her heavy vaginal bleeding but that is also the reason for the ablation to help with the heavy bleeding.  Pt stated understanding and had no further questions.

## 2011-12-16 NOTE — Telephone Encounter (Signed)
Pt called and stated that she has surgery scheduled for 01/04/12 and that she went to the [facility] where they told her she had fibroids.  Can someone call back please?

## 2011-12-21 ENCOUNTER — Encounter (HOSPITAL_COMMUNITY): Payer: Self-pay | Admitting: Pharmacist

## 2012-01-04 ENCOUNTER — Encounter (HOSPITAL_COMMUNITY)
Admission: RE | Disposition: A | Discharge status: Home / Self Care | Payer: Self-pay | Source: Ambulatory Visit | Attending: Obstetrics & Gynecology

## 2012-01-04 ENCOUNTER — Encounter (HOSPITAL_COMMUNITY): Payer: Self-pay | Admitting: Anesthesiology

## 2012-01-04 ENCOUNTER — Ambulatory Visit (HOSPITAL_COMMUNITY)
Admission: RE | Admit: 2012-01-04 | Discharge: 2012-01-04 | Disposition: A | Discharge status: Home / Self Care | Payer: Medicaid Other | Source: Ambulatory Visit | Attending: Obstetrics & Gynecology | Admitting: Obstetrics & Gynecology

## 2012-01-04 ENCOUNTER — Other Ambulatory Visit: Payer: Self-pay | Admitting: Obstetrics & Gynecology

## 2012-01-04 ENCOUNTER — Ambulatory Visit (HOSPITAL_COMMUNITY): Payer: Medicaid Other | Admitting: Anesthesiology

## 2012-01-04 ENCOUNTER — Encounter (HOSPITAL_COMMUNITY): Payer: Self-pay | Admitting: *Deleted

## 2012-01-04 DIAGNOSIS — N924 Excessive bleeding in the premenopausal period: Secondary | ICD-10-CM

## 2012-01-04 DIAGNOSIS — N949 Unspecified condition associated with female genital organs and menstrual cycle: Secondary | ICD-10-CM | POA: Insufficient documentation

## 2012-01-04 DIAGNOSIS — N938 Other specified abnormal uterine and vaginal bleeding: Secondary | ICD-10-CM | POA: Insufficient documentation

## 2012-01-04 HISTORY — PX: NOVASURE ABLATION: SHX5394

## 2012-01-04 LAB — CBC
HCT: 37 % (ref 36.0–46.0)
Hemoglobin: 12.6 g/dL (ref 12.0–15.0)
MCH: 28.5 pg (ref 26.0–34.0)
MCV: 83.7 fL (ref 78.0–100.0)
RBC: 4.42 MIL/uL (ref 3.87–5.11)
WBC: 7.9 10*3/uL (ref 4.0–10.5)

## 2012-01-04 SURGERY — NOVASURE ABLATION
Anesthesia: General | Site: Vagina | Wound class: Clean Contaminated

## 2012-01-04 MED ORDER — PROPOFOL 10 MG/ML IV EMUL
INTRAVENOUS | Status: AC
  Filled 2012-01-04: qty 20

## 2012-01-04 MED ORDER — ONDANSETRON HCL 4 MG/2ML IJ SOLN: 4.0000 mg | Freq: Once | INTRAMUSCULAR | Status: DC | PRN

## 2012-01-04 MED ORDER — KETOROLAC TROMETHAMINE 30 MG/ML IJ SOLN
INTRAMUSCULAR | Status: AC
  Filled 2012-01-04: qty 1

## 2012-01-04 MED ORDER — LACTATED RINGERS IV SOLN
INTRAVENOUS | Status: DC
  Administered 2012-01-04: 10:00:00 via INTRAVENOUS

## 2012-01-04 MED ORDER — FENTANYL CITRATE 0.05 MG/ML IJ SOLN
INTRAMUSCULAR | Status: AC
  Filled 2012-01-04: qty 2

## 2012-01-04 MED ORDER — BUPIVACAINE-EPINEPHRINE 0.5% -1:200000 IJ SOLN
INTRAMUSCULAR | Status: DC | PRN
  Administered 2012-01-04: 6 mL

## 2012-01-04 MED ORDER — MIDAZOLAM HCL 2 MG/2ML IJ SOLN
INTRAMUSCULAR | Status: AC
  Filled 2012-01-04: qty 2

## 2012-01-04 MED ORDER — MEPERIDINE HCL 25 MG/ML IJ SOLN: 6.2500 mg | INTRAMUSCULAR | Status: DC | PRN

## 2012-01-04 MED ORDER — ONDANSETRON HCL 4 MG/2ML IJ SOLN
INTRAMUSCULAR | Status: DC | PRN
  Administered 2012-01-04: 4 mg via INTRAVENOUS

## 2012-01-04 MED ORDER — ONDANSETRON HCL 4 MG/2ML IJ SOLN
INTRAMUSCULAR | Status: AC
  Filled 2012-01-04: qty 2

## 2012-01-04 MED ORDER — PROPOFOL 10 MG/ML IV EMUL
INTRAVENOUS | Status: DC | PRN
  Administered 2012-01-04: 160 mg via INTRAVENOUS

## 2012-01-04 MED ORDER — MIDAZOLAM HCL 5 MG/5ML IJ SOLN
INTRAMUSCULAR | Status: DC | PRN
  Administered 2012-01-04: 2 mg via INTRAVENOUS

## 2012-01-04 MED ORDER — FENTANYL CITRATE 0.05 MG/ML IJ SOLN
INTRAMUSCULAR | Status: DC | PRN
  Administered 2012-01-04: 2 ug via INTRAVENOUS

## 2012-01-04 MED ORDER — LIDOCAINE HCL (CARDIAC) 20 MG/ML IV SOLN
INTRAVENOUS | Status: DC | PRN
  Administered 2012-01-04: 60 mg via INTRAVENOUS

## 2012-01-04 MED ORDER — FENTANYL CITRATE 0.05 MG/ML IJ SOLN: 25.0000 ug | INTRAMUSCULAR | Status: DC | PRN

## 2012-01-04 MED ORDER — DEXAMETHASONE SODIUM PHOSPHATE 10 MG/ML IJ SOLN
INTRAMUSCULAR | Status: AC
  Filled 2012-01-04: qty 1

## 2012-01-04 MED ORDER — KETOROLAC TROMETHAMINE 30 MG/ML IJ SOLN: 15.0000 mg | Freq: Once | INTRAMUSCULAR | Status: DC | PRN

## 2012-01-04 MED ORDER — BUPIVACAINE-EPINEPHRINE (PF) 0.5% -1:200000 IJ SOLN
INTRAMUSCULAR | Status: AC
  Filled 2012-01-04: qty 10

## 2012-01-04 MED ORDER — HYDROCODONE-ACETAMINOPHEN 5-500 MG PO TABS: 1.0000 | ORAL_TABLET | Freq: Four times a day (QID) | ORAL | Status: AC | PRN

## 2012-01-04 MED ORDER — KETOROLAC TROMETHAMINE 30 MG/ML IJ SOLN
INTRAMUSCULAR | Status: DC | PRN
  Administered 2012-01-04: 30 mg via INTRAVENOUS

## 2012-01-04 MED ORDER — LIDOCAINE HCL (CARDIAC) 20 MG/ML IV SOLN
INTRAVENOUS | Status: AC
  Filled 2012-01-04: qty 5

## 2012-01-04 SURGICAL SUPPLY — 11 items
ABLATOR ENDOMETRIAL BIPOLAR (ABLATOR) ×2 IMPLANT
CATH ROBINSON RED A/P 16FR (CATHETERS) ×2 IMPLANT
CLOTH BEACON ORANGE TIMEOUT ST (SAFETY) ×2 IMPLANT
CONTAINER PREFILL 10% NBF 60ML (FORM) IMPLANT
GLOVE BIO SURGEON STRL SZ 6.5 (GLOVE) ×2 IMPLANT
GLOVE BIOGEL PI IND STRL 7.0 (GLOVE) ×2 IMPLANT
GLOVE BIOGEL PI INDICATOR 7.0 (GLOVE) ×2
GOWN PREVENTION PLUS LG XLONG (DISPOSABLE) ×4 IMPLANT
PACK VAGINAL MINOR WOMEN LF (CUSTOM PROCEDURE TRAY) ×2 IMPLANT
TOWEL OR 17X24 6PK STRL BLUE (TOWEL DISPOSABLE) ×4 IMPLANT
WATER STERILE IRR 1000ML POUR (IV SOLUTION) ×2 IMPLANT

## 2012-01-04 NOTE — Anesthesia Preprocedure Evaluation (Signed)
Anesthesia Evaluation  Patient identified by MRN, date of birth, ID band Patient awake    Reviewed: Allergy & Precautions, H&P , NPO status , Patient's Chart, lab work & pertinent test results  Airway Mallampati: II TM Distance: >3 FB Neck ROM: full    Dental No notable dental hx. (+) Teeth Intact   Pulmonary neg pulmonary ROS,    Pulmonary exam normal       Cardiovascular negative cardio ROS      Neuro/Psych negative neurological ROS  negative psych ROS   GI/Hepatic negative GI ROS, Neg liver ROS,   Endo/Other  Morbid obesity  Renal/GU negative Renal ROS  negative genitourinary   Musculoskeletal negative musculoskeletal ROS (+)   Abdominal (+) + obese,   Peds negative pediatric ROS (+)  Hematology negative hematology ROS (+)   Anesthesia Other Findings   Reproductive/Obstetrics negative OB ROS                           Anesthesia Physical Anesthesia Plan  ASA: III  Anesthesia Plan: General   Post-op Pain Management:    Induction: Intravenous  Airway Management Planned: LMA  Additional Equipment:   Intra-op Plan:   Post-operative Plan:   Informed Consent: I have reviewed the patients History and Physical, chart, labs and discussed the procedure including the risks, benefits and alternatives for the proposed anesthesia with the patient or authorized representative who has indicated his/her understanding and acceptance.     Plan Discussed with: CRNA and Surgeon  Anesthesia Plan Comments:         Anesthesia Quick Evaluation

## 2012-01-04 NOTE — Transfer of Care (Signed)
Immediate Anesthesia Transfer of Care Note  Patient: Melissa Pratt  Procedure(s) Performed: Procedure(s) (LRB): NOVASURE ABLATION (N/A)  Patient Location: PACU  Anesthesia Type: General  Level of Consciousness: awake, alert  and oriented  Airway & Oxygen Therapy: Patient Spontanous Breathing and Patient connected to nasal cannula oxygen  Post-op Assessment: Report given to PACU RN and Post -op Vital signs reviewed and stable  Post vital signs: Reviewed and stable  Complications: No apparent anesthesia complications

## 2012-01-04 NOTE — Anesthesia Postprocedure Evaluation (Signed)
Anesthesia Post Note  Patient: Melissa Pratt  Procedure(s) Performed: Procedure(s) (LRB): NOVASURE ABLATION (N/A)  Anesthesia type: General  Patient location: PACU  Post pain: Pain level controlled  Post assessment: Post-op Vital signs reviewed  Last Vitals:  Filed Vitals:   01/04/12 1200  BP: 111/61  Pulse: 81  Temp:   Resp: 20    Post vital signs: Reviewed  Level of consciousness: sedated  Complications: No apparent anesthesia complicationsfj

## 2012-01-04 NOTE — Discharge Instructions (Signed)
Endometrial Ablation Endometrial ablation removes the lining of the uterus (endometrium). It is usually a same day, outpatient treatment. Ablation helps avoid major surgery (such as a hysterectomy). A hysterectomy is removal of the cervix and uterus. Endometrial ablation has less risk and complications, has a shorter recovery period and is less expensive. After endometrial ablation, most women will have little or no menstrual bleeding. You may not keep your fertility. Pregnancy is no longer likely after this procedure but if you are pre-menopausal, you still need to use a reliable method of birth control following the procedure because pregnancy can occur. REASONS TO HAVE THE PROCEDURE MAY INCLUDE:  Heavy periods.   Bleeding that is causing anemia.   Anovulatory bleeding, very irregular, bleeding.   Bleeding submucous fibroids (on the lining inside the uterus) if they are smaller than 3 centimeters.  REASONS NOT TO HAVE THE PROCEDURE MAY INCLUDE:  You wish to have more children.   You have a pre-cancerous or cancerous problem. The cause of any abnormal bleeding must be diagnosed before having the procedure.   You have pain coming from the uterus.   You have a submucus fibroid larger than 3 centimeters.   You recently had a baby.   You recently had an infection in the uterus.   You have a severe retro-flexed, tipped uterus and cannot insert the instrument to do the ablation.   You had a Cesarean section or deep major surgery on the uterus.   The inner cavity of the uterus is too large for the endometrial ablation instrument.  RISKS AND COMPLICATIONS   Perforation of the uterus.   Bleeding.   Infection of the uterus, bladder or vagina.   Injury to surrounding organs.   Cutting the cervix.   An air bubble to the lung (air embolus).   Pregnancy following the procedure.   Failure of the procedure to help the problem requiring hysterectomy.   Decreased ability to diagnose  cancer in the lining of the uterus.  BEFORE THE PROCEDURE  The lining of the uterus must be tested to make sure there is no pre-cancerous or cancer cells present.   Medications may be given to make the lining of the uterus thinner.   Ultrasound may be used to evaluate the size and look for abnormalities of the uterus.   Future pregnancy is not desired.  PROCEDURE  There are different ways to destroy the lining of the uterus.   Resectoscope - radio frequency-alternating electric current is the most common one used.   Cryotherapy - freezing the lining of the uterus.   Heated Free Liquid - heated salt (saline) solution inserted into the uterus.   Microwave - uses high energy microwaves in the uterus.   Thermal Balloon - a catheter with a balloon tip is inserted into the uterus and filled with heated fluid.  Your caregiver will talk with you about the method used in this clinic. They will also instruct you on the pros and cons of the procedure. Endometrial ablation is performed along with a procedure called operative hysteroscopy. A narrow viewing tube is inserted through the birth canal (vagina) and through the cervix into the uterus. A tiny camera attached to the viewing tube (hysteroscope) allows the uterine cavity to be shown on a TV monitor during surgery. Your uterus is filled with a harmless liquid to make the procedure easier. The lining of the uterus is then removed. The lining can also be removed with a resectoscope which allows your surgeon   to cut away the lining of the uterus under direct vision. Usually, you will be able to go home within an hour after the procedure. HOME CARE INSTRUCTIONS   Do not drive for 24 hours.   No tampons, douching or intercourse for 2 weeks or until your caregiver approves.   Rest at home for 24 to 48 hours. You may then resume normal activities unless told differently by your caregiver.   Take your temperature two times a day for 4 days, and record  it.   Take any medications your caregiver has ordered, as directed.   Use some form of contraception if you are pre-menopausal and do not want to get pregnant.  Bleeding after the procedure is normal. It varies from light spotting and mildly watery to bloody discharge for 4 to 6 weeks. You may also have mild cramping. Only take over-the-counter or prescription medicines for pain, discomfort, or fever as directed by your caregiver. Do not use aspirin, as this may aggravate bleeding. Frequent urination during the first 24 hours is normal. You will not know how effective your surgery is until at least 3 months after the surgery. SEEK IMMEDIATE MEDICAL CARE IF:   Bleeding is heavier than a normal menstrual cycle.   An oral temperature above 102 F (38.9 C) develops.   You have increasing cramps or pains not relieved with medication or develop belly (abdominal) pain which does not seem to be related to the same area of earlier cramping and pain.   You are light headed, weak or have fainting episodes.   You develop pain in the shoulder strap areas.   You have chest or leg pain.   You have abnormal vaginal discharge.   You have painful urination.  Document Released: 05/08/2004 Document Revised: 06/18/2011 Document Reviewed: 08/06/2007 [employer] Patient Information 2012 [employer]. 

## 2012-01-04 NOTE — H&P (Addendum)
Melissa Pratt is an 36 y.o. female. LMP 12/26/11. She presents today for endometrial ablation with Novasure. She has heavy irregular menses, and a benign endometrial biopsy from 2012. The procedure and the risks of continued bleeding problems, amenorrhea, pain, infection, pelvic organ damage were discussed and her questions were answered.   Pertinent Gynecological History: Menses: flow is moderate to heavy Bleeding: dysfunctional uterine bleeding Contraception: tubal ligation DES exposure: denies Blood transfusions: none Sexually transmitted diseases: no past history Previous GYN Procedures:   Last mammogram: na Date:   Last pap: normal Date:  OB History: G3, P3   Menstrual History: Menarche age:      Past Medical History  Diagnosis Date  . Menorrhagia   . SVD (spontaneous vaginal delivery)     x 3    Past Surgical History  Procedure Date  . Tubal ligation   . Tonsillectomy     History reviewed. No pertinent family history.  Social History:  reports that she has never smoked. She has never used smokeless tobacco. She reports that she does not drink alcohol or use illicit drugs.  Allergies: No Known Allergies  No prescriptions prior to admission    ROS  Blood pressure 130/93, pulse 98, temperature 97.7 F (36.5 C), resp. rate 18, height 5\' 3"  (1.6 m), weight 99.338 kg (219 lb), last menstrual period 12/05/2011, SpO2 99.00%. Physical Exam  Constitutional: She is oriented to person, place, and time. She appears well-developed and well-nourished. No distress.  Cardiovascular: Normal rate and regular rhythm.   Respiratory: Effort normal and breath sounds normal.  GI: Soft. She exhibits no mass. There is no tenderness.  Neurological: She is alert and oriented to person, place, and time.  Skin: Skin is warm and dry.  Psychiatric: She has a normal mood and affect. Her behavior is normal.    No results found for this or any previous visit (from the past 24  hour(s)).  No results found.  Assessment/Plan: DUB, endometrial ablation by Novasure.  Docie Abramovich 01/04/2012, 10:02 AM

## 2012-01-04 NOTE — Op Note (Signed)
Procedure: Endometrial ablation with NovaSure Preoperative diagnosis: Dysfunctional uterine bleeding Postoperative diagnosis: Same Surgeon: Dr. Scheryl Darter Anesthesia: Gen., local intracervical block Specimen: None Complications: None Drains: None Counts: Correct   Patient gave written consent for endometrial ablation with NovaSure for dysfunctional uterine bleeding. Patient identification was confirmed and she was brought to the OR and general anesthesia was induced. She's placed in dorsal lithotomy position. The perineum and vagina were sterilely prepped and draped. The bladder was drained with a red rubber catheter. Exam under anesthesia revealed normal-size uterus no adnexal masses. Speculum was inserted and half percent Marcaine with 1 200,000 epinephrine was infiltrated as an intracervical block. 6 mL was used. Cervix was grasped with a single-tooth tenaculum. Uterus sounded to 8 cm. The cervix was dilated up to a 8 mm Hagar dilator. Cervical length was 4 cm. Uterine cavity length was 4 cm. The NovaSure device was inserted and deployed and the cavity width of 3.2 cm was obtained. Endometrial ablation was performed procedure lasting 2 minutes. All instruments were removed. Patient tolerated the procedure well without complications. She was brought in stable condition to the PACU.  Dr. Scheryl Darter 01/04/2012 11:55 AM

## 2012-01-05 ENCOUNTER — Encounter (HOSPITAL_COMMUNITY): Payer: Self-pay | Admitting: Obstetrics & Gynecology

## 2012-05-04 ENCOUNTER — Encounter (HOSPITAL_COMMUNITY): Payer: Self-pay | Admitting: Emergency Medicine

## 2012-05-04 ENCOUNTER — Emergency Department (HOSPITAL_COMMUNITY)
Admission: EM | Admit: 2012-05-04 | Discharge: 2012-05-04 | Disposition: A | Discharge status: Home / Self Care | Payer: Medicaid Other | Source: Home / Self Care | Attending: Emergency Medicine | Admitting: Emergency Medicine

## 2012-05-04 DIAGNOSIS — M654 Radial styloid tenosynovitis [de Quervain]: Secondary | ICD-10-CM

## 2012-05-04 DIAGNOSIS — L609 Nail disorder, unspecified: Secondary | ICD-10-CM

## 2012-05-04 MED ORDER — MELOXICAM 7.5 MG PO TABS: 7.5000 mg | ORAL_TABLET | Freq: Every day | ORAL | Status: AC

## 2012-05-04 NOTE — ED Provider Notes (Signed)
History     CSN: [id number]  Arrival date & time 05/04/12  1003   First MD Initiated Contact with Patient 05/04/12 1016      Chief Complaint  Patient presents with  . Hand Pain    (Consider location/radiation/quality/duration/timing/severity/associated sxs/prior treatment) HPI Comments: Patient presents to urgent care today complaining of pain in the dorsal aspect of her right wrist mainly in the area of her thumb. 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 She is right-handed. She denies any weakness, numbness or tingling sensation in the other fingers are affected hand. Denies any direct trauma or injury to her head. Patient also denies any constitutional symptoms such as fevers, myalgias arthralgias or unintentional weight loss.   patient also reports that her nails look abnormal for several months she admits that she has been using his artificial tears for several years but has stopped this over the last few months. Her nails look somewhat deformed and darker in the middle. Denies any tenderness, erythema, swelling or brittle nails.  Patient is a 36 y.o. female presenting with hand pain. The history is provided by the patient.  Hand Pain This is a new problem. The current episode started more than 2 days ago. The problem occurs constantly. The problem has been gradually worsening. Exacerbated by: Moving wrist and palpation.    Past Medical History  Diagnosis Date  . Menorrhagia   . SVD (spontaneous vaginal delivery)     x 3    Past Surgical History  Procedure Date  . Tubal ligation   . Tonsillectomy   . Novasure ablation 01/04/2012    Procedure: NOVASURE ABLATION;  Surgeon: Melissa Phenix, MD;  Location: [facility];  Service: Gynecology;  Laterality: N/A;    No family history on file.  History  Substance Use Topics  . Smoking status: Never Smoker   . Smokeless tobacco: Never Used  . Alcohol Use: No    OB History      Grav Para Term Preterm Abortions TAB SAB Ect Mult Living   3 3 3       3       Review of Systems  Constitutional: Negative for fever, appetite change and unexpected weight change.  Musculoskeletal: Negative for myalgias, back pain, joint swelling and arthralgias.  Skin: Negative for color change, pallor, rash and wound.    Allergies  Review of patient's allergies indicates no known allergies.  Home Medications   Current Outpatient Rx  Name Route Sig Dispense Refill  . MELOXICAM 7.5 MG PO TABS Oral Take 1 tablet (7.5 mg total) by mouth daily. 14 tablet 0    BP 127/90  Pulse 69  Temp 98.2 F (36.8 C) (Oral)  Resp 24  SpO2 99%  Physical Exam  Nursing note and vitals reviewed. Constitutional: She appears well-developed and well-nourished.  Musculoskeletal: She exhibits tenderness.       Hands: Neurological: She is alert. She exhibits normal muscle tone. Coordination normal.  Skin: Skin is warm. No rash noted. No erythema.    ED Course  Procedures (including critical care time)  Labs Reviewed - No data to display No results found.   1. De Quervain's tenosynovitis, right   2. Nail abnormality       MDM  Problem #1 wrist pain ,Patient reporting occupational activity of repetitive recurrent hand movements. Presents with tenderness suggestive anatomically elevate her veins tendinitis. No direct injury or trauma to her  hand. X-rays were not performed today based on exam and history. Have encouraged patient to use a thumb spica for 5-7 days for comfort and to pursue a anti-inflammatory course with meloxicam for 2 weeks. If discomfort persists beyond this timeframe was recommended to followup with a hand orthopedic Dr. further treatment and management. She agrees and we have provided a working note with restrictions for 5-7 days.  Problem #2 nail abnormalities. Patient was a recurrent user of artificial manicure/nails. I presume this changes or not secondary to unequal  mycosis but more a result of the artificial nail chronic usage. Have advised patient to wait at least 8-9 months and if no improvement or worsening to followup with the dermatologist to do a nail culture describing to rule out onychomycosis. She agrees with treatment plan conservative observation and followup as necessary.        Jimmie Molly, MD 05/04/12 1245

## 2012-05-04 NOTE — ED Notes (Signed)
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  Pain and swelling in right hand, particularly around thumb.  Patient is right handed

## 2012-12-01 ENCOUNTER — Emergency Department (INDEPENDENT_AMBULATORY_CARE_PROVIDER_SITE_OTHER): Payer: Medicaid Other

## 2012-12-01 ENCOUNTER — Encounter (HOSPITAL_COMMUNITY): Payer: Self-pay | Admitting: Emergency Medicine

## 2012-12-01 ENCOUNTER — Emergency Department (HOSPITAL_COMMUNITY)
Admission: EM | Admit: 2012-12-01 | Discharge: 2012-12-01 | Disposition: A | Discharge status: Home / Self Care | Payer: Medicaid Other | Source: Home / Self Care | Attending: Family Medicine | Admitting: Family Medicine

## 2012-12-01 DIAGNOSIS — S93609A Unspecified sprain of unspecified foot, initial encounter: Secondary | ICD-10-CM

## 2012-12-01 DIAGNOSIS — S93509A Unspecified sprain of unspecified toe(s), initial encounter: Secondary | ICD-10-CM

## 2012-12-01 MED ORDER — IBUPROFEN 600 MG PO TABS
600.0000 mg | ORAL_TABLET | Freq: Three times a day (TID) | ORAL | Status: DC | PRN
Start: 2012-12-01 — End: 2014-07-30

## 2012-12-01 NOTE — ED Notes (Signed)
Pt c/o left 2nd toe from greater toe inj onset yest Reports hitting her toe against son's shoe as she went to hug him Sx include swelling that has improved and pain when applies pressure She is alert and oriented w/no signs of acute distress.

## 2012-12-01 NOTE — ED Provider Notes (Signed)
History     CSN: [id number]  Arrival date & time 12/01/12  1018   First MD Initiated Contact with Patient 12/01/12 1035      Chief Complaint  Patient presents with  . Toe Injury    (Consider location/radiation/quality/duration/timing/severity/associated sxs/prior treatment) HPI Comments: 37 year old female here complaining of pain and swelling in her left second toe. Patient reports she injured her toe yesterday after hitting against her symptoms heart shoe sole. Has been swollen and tender at the tip since, she thinks swelling is actually improving today. No wounds. Not taking any medication for her symptoms.   Past Medical History  Diagnosis Date  . Menorrhagia   . SVD (spontaneous vaginal delivery)     x 3    Past Surgical History  Procedure Laterality Date  . Tubal ligation    . Tonsillectomy    . Novasure ablation  01/04/2012    Procedure: NOVASURE ABLATION;  Surgeon: Adam Phenix, MD;  Location: [facility];  Service: Gynecology;  Laterality: N/A;    No family history on file.  History  Substance Use Topics  . Smoking status: Never Smoker   . Smokeless tobacco: Never Used  . Alcohol Use: No    OB History   Grav Para Term Preterm Abortions TAB SAB Ect Mult Living   3 3 3       3       Review of Systems  Musculoskeletal:       Left toe injury as per history present illness.  Skin: Negative for wound.  Neurological: Negative for numbness.  All other systems reviewed and are negative.    Allergies  Review of patient's allergies indicates no known allergies.  Home Medications   Current Outpatient Rx  Name  Route  Sig  Dispense  Refill  . ibuprofen (ADVIL,MOTRIN) 600 MG tablet   Oral   Take 1 tablet (600 mg total) by mouth every 8 (eight) hours as needed for pain.   20 tablet   0     BP 117/82  Pulse 78  Temp(Src) 97.8 F (36.6 C) (Oral)  Resp 18  SpO2 96%  LMP 11/04/2012  Physical Exam  Nursing note and vitals reviewed. Constitutional: She  is oriented to person, place, and time. She appears well-developed and well-nourished. No distress.  HENT:  Head: Normocephalic and atraumatic.  Cardiovascular: Normal heart sounds.   Pulmonary/Chest: Breath sounds normal.  Musculoskeletal:  Left foot: No obvious deformity or swelling. Left second toe: No obvious deformity. There is mild swelling and tenderness to palpation at the level of the distal interphalangeal joint. No tenderness redness or swelling a day MP or proximal IP joint. Nail is intact. No hematoma, bruising, wounds or skin breaks. Toe pad pink with normal capillary refill.  Neurological: She is alert and oriented to person, place, and time.  Skin: She is not diaphoretic.    ED Course  Procedures (including critical care time)  Labs Reviewed - No data to display Dg Toe 2nd Left  12/01/2012   *RADIOLOGY REPORT*  Clinical Data: Stubbed second toe on a shoe yesterday.  LEFT SECOND TOE  Comparison: None.  Findings: The joints of the second toe are aligned.  No acute or healing fracture is identified.  No soft tissue swelling appreciated by radiographs.  IMPRESSION: No acute bony abnormality.   Original Report Authenticated By: Britta Mccreedy, M.D.     1. Toe sprain, initial encounter       MDM  Applied buddy taping. Provided  a rigid soled shoe Prescribed ibuprofen.. Supportive care and red flags should prompt her return to medical attention discussed with patient and provided in writing.        Sharin Grave, MD 12/02/12 [phone]

## 2014-05-14 ENCOUNTER — Encounter (HOSPITAL_COMMUNITY): Payer: Self-pay | Admitting: Emergency Medicine

## 2014-07-30 ENCOUNTER — Emergency Department (HOSPITAL_COMMUNITY)
Admission: EM | Admit: 2014-07-30 | Discharge: 2014-07-30 | Disposition: A | Discharge status: Home / Self Care | Payer: Medicaid Other | Attending: Emergency Medicine | Admitting: Emergency Medicine

## 2014-07-30 ENCOUNTER — Emergency Department (HOSPITAL_COMMUNITY): Payer: Medicaid Other

## 2014-07-30 ENCOUNTER — Encounter (HOSPITAL_COMMUNITY): Payer: Self-pay | Admitting: Emergency Medicine

## 2014-07-30 DIAGNOSIS — Y998 Other external cause status: Secondary | ICD-10-CM | POA: Insufficient documentation

## 2014-07-30 DIAGNOSIS — Y9389 Activity, other specified: Secondary | ICD-10-CM | POA: Diagnosis not present

## 2014-07-30 DIAGNOSIS — S6992XA Unspecified injury of left wrist, hand and finger(s), initial encounter: Secondary | ICD-10-CM | POA: Diagnosis present

## 2014-07-30 DIAGNOSIS — T1490XA Injury, unspecified, initial encounter: Secondary | ICD-10-CM

## 2014-07-30 DIAGNOSIS — W2209XA Striking against other stationary object, initial encounter: Secondary | ICD-10-CM | POA: Insufficient documentation

## 2014-07-30 DIAGNOSIS — E119 Type 2 diabetes mellitus without complications: Secondary | ICD-10-CM | POA: Insufficient documentation

## 2014-07-30 DIAGNOSIS — S60042A Contusion of left ring finger without damage to nail, initial encounter: Secondary | ICD-10-CM | POA: Diagnosis not present

## 2014-07-30 DIAGNOSIS — Z8742 Personal history of other diseases of the female genital tract: Secondary | ICD-10-CM | POA: Diagnosis not present

## 2014-07-30 DIAGNOSIS — R52 Pain, unspecified: Secondary | ICD-10-CM

## 2014-07-30 DIAGNOSIS — Y9289 Other specified places as the place of occurrence of the external cause: Secondary | ICD-10-CM | POA: Insufficient documentation

## 2014-07-30 DIAGNOSIS — S6000XA Contusion of unspecified finger without damage to nail, initial encounter: Secondary | ICD-10-CM

## 2014-07-30 HISTORY — DX: Type 2 diabetes mellitus without complications: E11.9

## 2014-07-30 MED ORDER — IBUPROFEN 600 MG PO TABS: 600.0000 mg | ORAL_TABLET | Freq: Four times a day (QID) | ORAL | Status: DC | PRN

## 2014-07-30 MED ORDER — HYDROCODONE-ACETAMINOPHEN 5-325 MG PO TABS: 1.0000 | ORAL_TABLET | Freq: Four times a day (QID) | ORAL | Status: DC | PRN

## 2014-07-30 MED ORDER — IBUPROFEN 400 MG PO TABS
600.0000 mg | ORAL_TABLET | Freq: Once | ORAL | Status: AC
  Administered 2014-07-30: 600 mg via ORAL
  Filled 2014-07-30 (×2): qty 1

## 2014-07-30 NOTE — ED Notes (Signed)
Pt. reports injury to left ring finger today , accidentally hit her left hand against door , skin intact / mild swelling .

## 2014-07-30 NOTE — ED Notes (Signed)
Pt. Left with all belongings and refused wheelchair 

## 2014-07-30 NOTE — ED Notes (Signed)
Patient states that she was catching balloons and hit her left hand on the car.  C/O pain in her left ring finger that goes down to her palm. Slight swelling noted

## 2014-07-30 NOTE — ED Provider Notes (Signed)
CSN: [id number]     Arrival date & time 07/30/14  2035 History  This chart was scribed for non-physician practitioner, Kerrie Buffalo, FNP working with Rolland Porter, MD by Greggory Stallion, ED scribe. This patient was seen in room TR06C/TR06C and the patient's care was started at 8:57 PM.   Chief Complaint  Patient presents with  . Finger Injury   The history is provided by the patient. No language interpreter was used.    HPI Comments: Melissa Pratt is a 39 y.o. female who presents to the Emergency Department complaining of left ring finger injury that occurred today around 4 PM. States she accidentally hit the back side of her hand against a door. Reports sudden onset pain with associated mild swelling. Pain radiates into the palm of her arm. Bending her finger worsens pain. She has not yet done anything for her symptoms.   Past Medical History  Diagnosis Date  . Menorrhagia   . SVD (spontaneous vaginal delivery)     x 3  . Diabetes mellitus without complication    Past Surgical History  Procedure Laterality Date  . Tubal ligation    . Tonsillectomy    . Novasure ablation  01/04/2012    Procedure: NOVASURE ABLATION;  Surgeon: Adam Phenix, MD;  Location: [facility];  Service: Gynecology;  Laterality: N/A;   No family history on file. History  Substance Use Topics  . Smoking status: Never Smoker   . Smokeless tobacco: Never Used  . Alcohol Use: No   OB History    Gravida Para Term Preterm AB TAB SAB Ectopic Multiple Living   Review of Systems  Musculoskeletal: Positive for joint swelling and arthralgias.       Pain left ring finger  All other systems reviewed and are negative.  Allergies  Review of patient's allergies indicates no known allergies.  Home Medications   Prior to Admission medications   Medication Sig Start Date End Date Taking? Authorizing Provider  HYDROcodone-acetaminophen (NORCO) 5-325 MG per tablet Take 1 tablet by mouth every 6 (six)  hours as needed for moderate pain. 07/30/14   Farouk Vivero Orlene Och, NP  ibuprofen (ADVIL,MOTRIN) 600 MG tablet Take 1 tablet (600 mg total) by mouth every 6 (six) hours as needed. 07/30/14   Adrien Dietzman Orlene Och, NP   BP 104/77 mmHg  Pulse 81  Temp(Src) 98.2 F (36.8 C) (Oral)  Resp 16  SpO2 100%  LMP 07/19/2014   Physical Exam  Constitutional: She is oriented to person, place, and time. She appears well-developed and well-nourished. No distress.  HENT:  Head: Normocephalic and atraumatic.  Eyes: Conjunctivae and EOM are normal.  Neck: Neck supple.  Cardiovascular: Normal rate.   Pulses intact. Brisk capillary refill.   Pulmonary/Chest: Effort normal. No respiratory distress.  Musculoskeletal: Normal range of motion.  Tenderness that starts at tip of left ring finger that radiates into the palmar aspect of hand. There is ecchymosis noted to the palmar aspect of the finger and hand.  Neurological: She is alert and oriented to person, place, and time.  Sensation intact.  Skin: Skin is warm and dry.  Psychiatric: She has a normal mood and affect. Her behavior is normal.  Nursing note and vitals reviewed.   ED Course  Procedures (including critical care time)  DIAGNOSTIC STUDIES: Oxygen Saturation is 100% on RA, normal by my interpretation.    COORDINATION OF CARE: 8:59 PM-Discussed  treatment plan which includes left hand xray and pain medication with pt at bedside and pt agreed to plan.   Labs Review Dg Finger Ring Left  07/30/2014   CLINICAL DATA:  Finger injury today  EXAM: LEFT RING FINGER 2+V  COMPARISON:  None.  FINDINGS: No acute fracture.  No dislocation.  IMPRESSION: No acute bony pathology.   Electronically Signed   By: Maryclare BeanArt  Hoss M.D.   On: 07/30/2014 21:26     MDM  39 y.o. female with pain and bruising to the left ring finger s/p injury earlier today. Stable for discharge without neurovascular deficits. Finger splinted, pain management.  Final diagnoses:  Contusion of finger,  left, initial encounter   I personally performed the services described in this documentation, which was scribed in my presence. The recorded information has been reviewed and is accurate.  [address]Abrahim Sargent [place]th[place] Aunya Lemler, [place]NP 07/30/14 [phone]  Rolland PorterMark James, MD 08/04/14 [phone]

## 2014-12-07 ENCOUNTER — Other Ambulatory Visit: Payer: Self-pay | Admitting: Nurse Practitioner

## 2014-12-07 DIAGNOSIS — Z1231 Encounter for screening mammogram for malignant neoplasm of breast: Secondary | ICD-10-CM

## 2014-12-20 ENCOUNTER — Ambulatory Visit: Payer: Medicaid Other

## 2015-03-02 ENCOUNTER — Encounter (HOSPITAL_COMMUNITY): Payer: Self-pay

## 2015-03-02 DIAGNOSIS — Z8742 Personal history of other diseases of the female genital tract: Secondary | ICD-10-CM | POA: Diagnosis not present

## 2015-03-02 DIAGNOSIS — N390 Urinary tract infection, site not specified: Secondary | ICD-10-CM | POA: Insufficient documentation

## 2015-03-02 DIAGNOSIS — Z3202 Encounter for pregnancy test, result negative: Secondary | ICD-10-CM | POA: Diagnosis not present

## 2015-03-02 DIAGNOSIS — E119 Type 2 diabetes mellitus without complications: Secondary | ICD-10-CM | POA: Insufficient documentation

## 2015-03-02 DIAGNOSIS — R300 Dysuria: Secondary | ICD-10-CM | POA: Diagnosis present

## 2015-03-02 LAB — URINALYSIS, ROUTINE W REFLEX MICROSCOPIC
Bilirubin Urine: NEGATIVE
GLUCOSE, UA: 100 mg/dL — AB
Ketones, ur: NEGATIVE mg/dL
Nitrite: NEGATIVE
PH: 5.5 (ref 5.0–8.0)
Protein, ur: 30 mg/dL — AB
SPECIFIC GRAVITY, URINE: 1.03 (ref 1.005–1.030)
Urobilinogen, UA: 1 mg/dL (ref 0.0–1.0)

## 2015-03-02 LAB — POC URINE PREG, ED: Preg Test, Ur: NEGATIVE

## 2015-03-02 LAB — URINE MICROSCOPIC-ADD ON

## 2015-03-02 NOTE — ED Notes (Signed)
Pt states she has been having problems with back pain on both sides and urinary frequency and dysuria. This has been going on for a week now.

## 2015-03-03 ENCOUNTER — Emergency Department (HOSPITAL_COMMUNITY)
Admission: EM | Admit: 2015-03-03 | Discharge: 2015-03-03 | Disposition: A | Discharge status: Home / Self Care | Payer: Medicaid Other | Attending: Emergency Medicine | Admitting: Emergency Medicine

## 2015-03-03 DIAGNOSIS — N390 Urinary tract infection, site not specified: Secondary | ICD-10-CM

## 2015-03-03 MED ORDER — CEPHALEXIN 500 MG PO CAPS: 500.0000 mg | ORAL_CAPSULE | Freq: Two times a day (BID) | ORAL | Status: DC

## 2015-03-03 MED ORDER — CEPHALEXIN 250 MG PO CAPS
500.0000 mg | ORAL_CAPSULE | Freq: Once | ORAL | Status: AC
  Administered 2015-03-03: 500 mg via ORAL
  Filled 2015-03-03: qty 2

## 2015-03-03 NOTE — ED Notes (Signed)
MD at bedside. 

## 2015-03-03 NOTE — ED Notes (Signed)
MD to see pt before RN assessment.

## 2015-03-03 NOTE — Discharge Instructions (Signed)
Urinary Tract Infection Ms. Rigg, your urine shows an infection today.  Take antibiotics as prescribed and see a primary care doctor within 3 days for close follow up.  If symptoms worsen, come back to the ED immediately.  Thank you. A urinary tract infection (UTI) can occur any place along the urinary tract. The tract includes the kidneys, ureters, bladder, and urethra. A type of germ called bacteria often causes a UTI. UTIs are often helped with antibiotic medicine.  HOME CARE   If given, take antibiotics as told by your doctor. Finish them even if you start to feel better.  Drink enough fluids to keep your pee (urine) clear or pale yellow.  Avoid tea, drinks with caffeine, and bubbly (carbonated) drinks.  Pee often. Avoid holding your pee in for a long time.  Pee before and after having sex (intercourse).  Wipe from front to back after you poop (bowel movement) if you are a woman. Use each tissue only once. GET HELP RIGHT AWAY IF:   You have back pain.  You have lower belly (abdominal) pain.  You have chills.  You feel sick to your stomach (nauseous).  You throw up (vomit).  Your burning or discomfort with peeing does not go away.  You have a fever.  Your symptoms are not better in 3 days. MAKE SURE YOU:   Understand these instructions.  Will watch your condition.  Will get help right away if you are not doing well or get worse. Document Released: 12/16/2007 Document Revised: 03/23/2012 Document Reviewed: 01/28/2012 [facility] Patient Information 2015 [place], [place]. This information is not intended to replace advice given to you by your health care provider. Make sure you discuss any questions you have with your health care provider.

## 2015-03-03 NOTE — ED Provider Notes (Signed)
History  This chart was scribed for Melissa Crumble, MD by Karle Plumber, ED Scribe. This patient was seen in room A13C/A13C and the patient's care was started at 1:11 AM.  Chief Complaint  Patient presents with  . Back Pain  . Dysuria  . Urinary Frequency   The history is provided by the patient and medical records. No language interpreter was used.    HPI Comments:  Melissa Pratt is a 39 y.o. morbidly obese female who presents to the Emergency Department complaining of lower back pain and dysuria that began approximately on week ago. She reports associated frequency in urination and mild abdominal pain. She has not taken anything to treat her symptoms. She denies modifying factors. She denies nausea, vomiting, fever or chills.   Past Medical History  Diagnosis Date  . Menorrhagia   . SVD (spontaneous vaginal delivery)     x 3  . Diabetes mellitus without complication    Past Surgical History  Procedure Laterality Date  . Tubal ligation    . Tonsillectomy    . Novasure ablation  01/04/2012    Procedure: NOVASURE ABLATION;  Surgeon: Adam Phenix, MD;  Location: [facility];  Service: Gynecology;  Laterality: N/A;   No family history on file. Social History  Substance Use Topics  . Smoking status: Never Smoker   . Smokeless tobacco: Never Used  . Alcohol Use: No   OB History    Gravida Para Term Preterm AB TAB SAB Ectopic Multiple Living   3 3 3       3      Review of Systems A complete 10 system review of systems was obtained and all systems are negative except as noted in the HPI and PMH.   Allergies  Review of patient's allergies indicates no known allergies.  Home Medications   Prior to Admission medications   Medication Sig Start Date End Date Taking? Authorizing Provider  HYDROcodone-acetaminophen (NORCO) 5-325 MG per tablet Take 1 tablet by mouth every 6 (six) hours as needed for moderate pain. 07/30/14   Hope Orlene Och, NP  ibuprofen (ADVIL,MOTRIN) 600 MG  tablet Take 1 tablet (600 mg total) by mouth every 6 (six) hours as needed. 07/30/14   Hope Orlene Och, NP   Triage Vitals: BP 132/96 mmHg  Pulse 85  Temp(Src) 97.7 F (36.5 C) (Oral)  Resp 16  Ht 5\' 3"  (1.6 m)  Wt 220 lb 3 oz (99.876 kg)  BMI 39.01 kg/m2  SpO2 96%  LMP 02/28/2015 Physical Exam  Constitutional: She is oriented to person, place, and time. She appears well-developed and well-nourished. No distress.  HENT:  Head: Normocephalic and atraumatic.  Nose: Nose normal.  Mouth/Throat: Oropharynx is clear and moist. No oropharyngeal exudate.  Eyes: Conjunctivae and EOM are normal. Pupils are equal, round, and reactive to light. No scleral icterus.  Neck: Normal range of motion. Neck supple. No JVD present. No tracheal deviation present. No thyromegaly present.  Cardiovascular: Normal rate, regular rhythm and normal heart sounds.  Exam reveals no gallop and no friction rub.   No murmur heard. Pulmonary/Chest: Effort normal and breath sounds normal. No respiratory distress. She has no wheezes. She exhibits no tenderness.  Abdominal: Soft. Bowel sounds are normal. She exhibits no distension and no mass. There is tenderness in the suprapubic area. There is no rebound and no guarding.  Musculoskeletal: Normal range of motion. She exhibits no edema or tenderness.  Lymphadenopathy:    She has no cervical adenopathy.  Neurological: She is alert and oriented to person, place, and time. No cranial nerve deficit. She exhibits normal muscle tone.  Skin: Skin is warm and dry. No rash noted. No erythema. No pallor.  Nursing note and vitals reviewed.   ED Course  Procedures (including critical care time) DIAGNOSTIC STUDIES: Oxygen Saturation is 96% on RA, adequate by my interpretation.   COORDINATION OF CARE: 1:15 AM- Pt verbalizes understanding and agrees to plan.  Medications - No data to display  Labs Review Labs Reviewed  URINALYSIS, ROUTINE W REFLEX MICROSCOPIC (NOT AT [facility]) -  Abnormal; Notable for the following:    APPearance CLOUDY (*)    Glucose, UA 100 (*)    Hgb urine dipstick MODERATE (*)    Protein, ur 30 (*)    Leukocytes, UA MODERATE (*)    All other components within normal limits  URINE MICROSCOPIC-ADD ON - Abnormal; Notable for the following:    Bacteria, UA FEW (*)    Crystals CA OXALATE CRYSTALS (*)    All other components within normal limits  POC URINE PREG, ED    Imaging Review No results found. I have personally reviewed and evaluated these images and lab results as part of my medical decision-making.   EKG Interpretation None      MDM   Final diagnoses:  None    Patient presents to the emergency department for back pain and dysuria. She is concerned for urinary infection and her urinalysis confirms this. She was given Keflex and a prescription. She has primary care follow-up on Friday. She appears well in no acute distress. Vital signs within her normal limits and she is safe for discharge.  I personally performed the services described in this documentation, which was scribed in my presence. The recorded information has been reviewed and is accurate.    Melissa Crumble, MD 03/03/15 [phone]

## 2015-12-20 IMAGING — CR DG FINGER RING 2+V*L*
3 series · 3 of 3 positions shown · non-contrast
Comparison: None.

CLINICAL DATA: Finger injury today

EXAM:
LEFT RING FINGER 2+V

[x finger pa left]
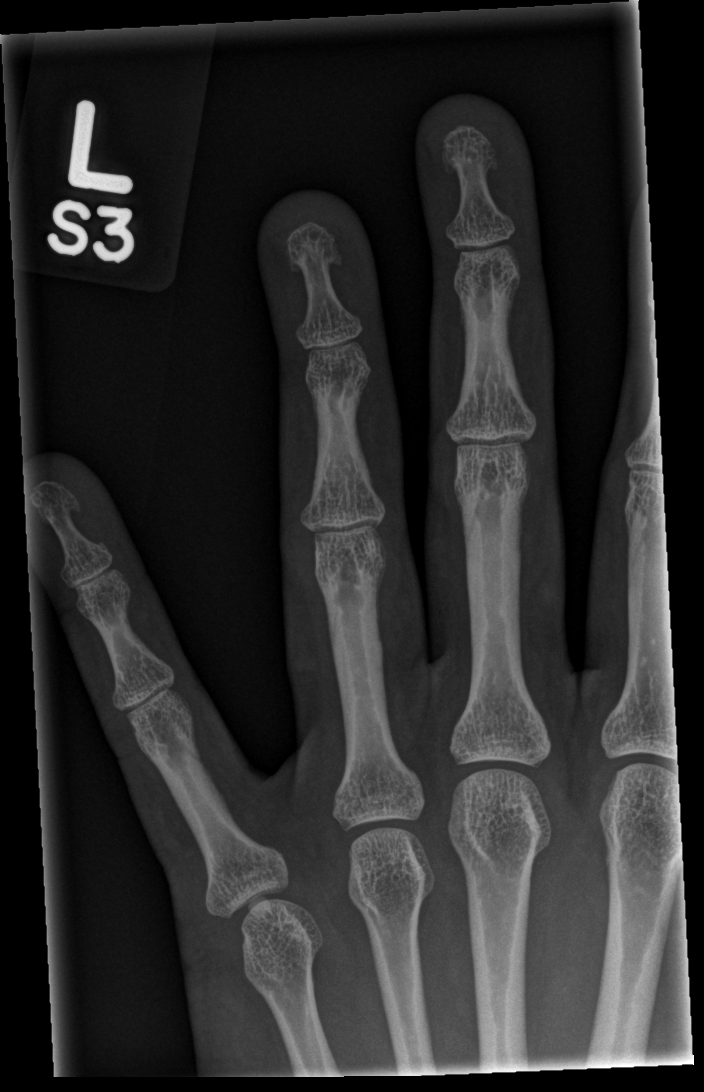

[x finger obl left]
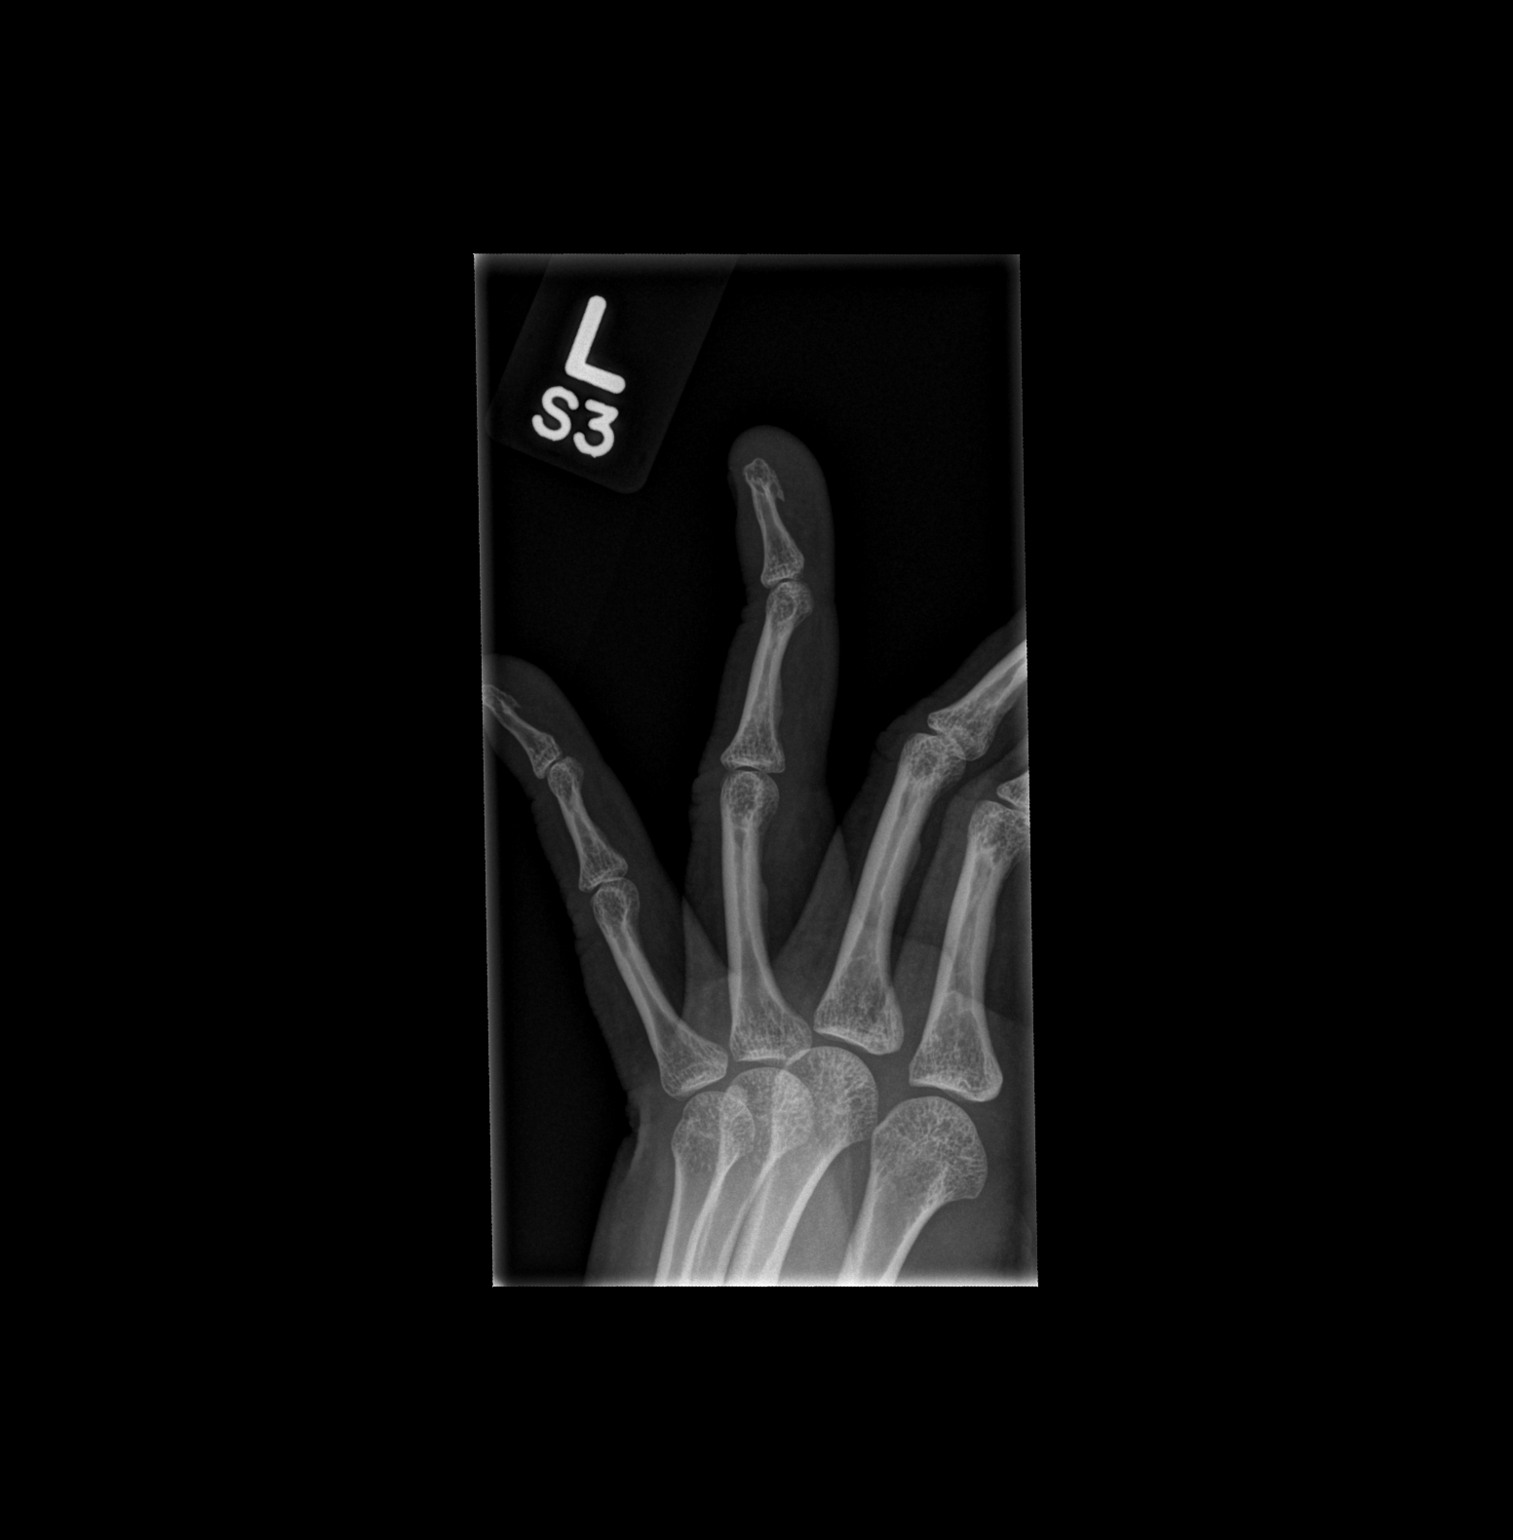

[x finger lat left]
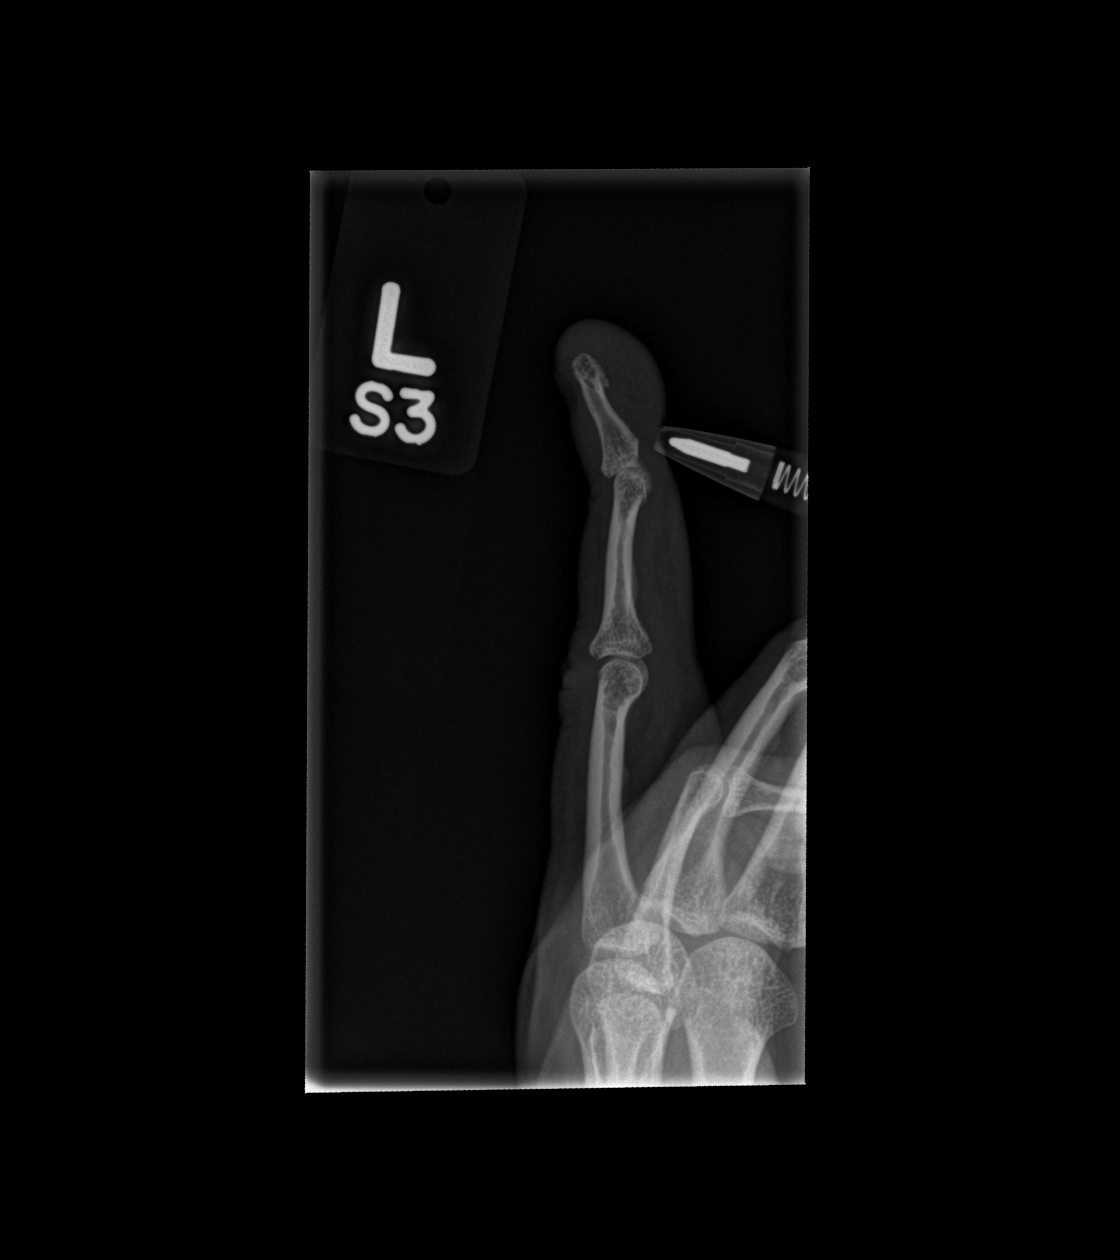

[3 of 3 positions shown; findings below may reference images not displayed]

FINDINGS: No acute fracture.  No dislocation.
IMPRESSION: No acute bony pathology.

## 2016-08-19 ENCOUNTER — Encounter (HOSPITAL_COMMUNITY): Payer: Self-pay | Admitting: Family Medicine

## 2016-08-19 ENCOUNTER — Ambulatory Visit (HOSPITAL_COMMUNITY)
Admission: EM | Admit: 2016-08-19 | Discharge: 2016-08-19 | Disposition: A | Discharge status: Home / Self Care | Payer: Self-pay | Attending: Family Medicine | Admitting: Family Medicine

## 2016-08-19 DIAGNOSIS — M722 Plantar fascial fibromatosis: Secondary | ICD-10-CM

## 2016-08-19 MED ORDER — PREDNISONE 20 MG PO TABS: ORAL_TABLET | ORAL | 0 refills | Status: DC

## 2016-08-19 NOTE — Discharge Instructions (Signed)
You need to get a insert for your right shoe that has a metatarsal arch support.

## 2016-08-19 NOTE — ED Provider Notes (Signed)
[facility]    CSN: [id number] Arrival date & time: 08/19/16  1011     History   Chief Complaint Chief Complaint  Patient presents with  . Foot Pain    HPI Melissa Pratt is a 41 y.o. female.   This is a 41 year old woman who comes in complaining of pain in her right foot instep. 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 She's had the pain in the instep for about a week. It's worse when she's walking on her bare floor in the morning.  Patient reports no trauma      Past Medical History:  Diagnosis Date  . Diabetes mellitus without complication (HCC)   . Menorrhagia   . SVD (spontaneous vaginal delivery)    x 3    Patient Active Problem List   Diagnosis Date Noted  . DUB (dysfunctional uterine bleeding) 10/22/2011    Past Surgical History:  Procedure Laterality Date  . NOVASURE ABLATION  01/04/2012   Procedure: NOVASURE ABLATION;  Surgeon: Adam PhenixJames G Arnold, MD;  Location: [facility];  Service: Gynecology;  Laterality: N/A;  . TONSILLECTOMY    . TUBAL LIGATION      OB History    Gravida Para Term Preterm AB Living   3 3 3     3    SAB TAB Ectopic Multiple Live Births           3       Home Medications    Prior to Admission medications   Medication Sig Start Date End Date Taking? Authorizing Provider  predniSONE (DELTASONE) 20 MG tablet Two daily with food 08/19/16   Elvina SidleKurt Carmon Sahli, MD    Family History History reviewed. No pertinent family history.  Social History Social History  Substance Use Topics  . Smoking status: Never Smoker  . Smokeless tobacco: Never Used  . Alcohol use No     Allergies   Patient has no known allergies.   Review of Systems Review of Systems  Constitutional: Negative.   HENT: Negative.   Musculoskeletal: Positive for gait problem.     Physical Exam Triage Vital Signs ED Triage Vitals [08/19/16 1141]  Enc Vitals Group     BP 128/90     Pulse  Rate 87     Resp 18     Temp 98.2 F (36.8 C)     Temp src      SpO2 100 %     Weight      Height      Head Circumference      Peak Flow      Pain Score 8     Pain Loc      Pain Edu?      Excl. in GC?    No data found.   Updated Vital Signs BP 128/90   Pulse 87   Temp 98.2 F (36.8 C)   Resp 18   LMP 07/13/2016   SpO2 100%    Physical Exam  Constitutional: She is oriented to person, place, and time. She appears well-developed and well-nourished.  HENT:  Head: Normocephalic.  Right Ear: External ear normal.  Left Ear: External ear normal.  Mouth/Throat: Oropharynx is clear and moist.  Eyes: Conjunctivae are normal.  Neck: Normal range of motion. Neck supple.  Pulmonary/Chest: Effort normal.  Musculoskeletal: She exhibits tenderness. She exhibits no deformity.  Examination the right foot reveals medial calcaneal tenderness and thickening (callus)  overlying the tender area.  Neurological: She is alert and oriented to person, place, and time.  Skin: Skin is warm and dry.  Nursing note and vitals reviewed.    UC Treatments / Results  Labs (all labs ordered are listed, but only abnormal results are displayed) Labs Reviewed - No data to display  EKG  EKG Interpretation None       Radiology No results found.  Procedures Procedures (including critical care time)  Medications Ordered in UC Medications - No data to display   Initial Impression / Assessment and Plan / UC Course  I have reviewed the triage vital signs and the nursing notes.  Pertinent labs & imaging results that were available during my care of the patient were reviewed by me and considered in my medical decision making (see chart for details).     Final Clinical Impressions(s) / UC Diagnoses   Final diagnoses:  Plantar fasciitis of right foot    New Prescriptions New Prescriptions   PREDNISONE (DELTASONE) 20 MG TABLET    Two daily with food     Elvina Sidle, MD 08/19/16  1150

## 2016-08-19 NOTE — ED Triage Notes (Signed)
Pt here for pain to the medial part of right foot.

## 2016-10-02 ENCOUNTER — Ambulatory Visit: Payer: Medicaid Other | Admitting: Internal Medicine

## 2016-10-02 NOTE — Progress Notes (Deleted)
   [facility] Phone: [phone]   Date of Visit: 10/02/2016   HPI:  Patient presents today to establish care.   Concerns today: *** Periods: *** Contraception: *** Pelvic symptoms: *** Sexual activity: *** STD Screening: *** Pap smear status: *** Exercise: *** Diet: *** Smoking: *** Alcohol: *** Drugs: *** Advance directives: *** Mood: *** Dentist: ***  ROS: See HPI  PMFSH:  PMH:  HLD DM2 Chronic Upper Back Pain Breast Hypertrophy Vitamin D Deficiency Hx Dysfunctional Uterine Bleeding  SH:   PHYSICAL EXAM: There were no vitals taken for this visit. Gen: NAD, pleasant, cooperative HEENT: NCAT, PERRL, no palpable thyromegaly or anterior cervical lymphadenopathy Heart: RRR, no murmurs Lungs: CTAB, NWOB Abdomen: soft, nontender to palpation Neuro: grossly nonfocal, speech normal GU: normal appearing external genitalia without lesions. Vagina is moist with white discharge. Cervix normal in appearance. No cervical motion tenderness or tenderness on bimanual exam. No adnexal masses.   ASSESSMENT/PLAN:  # Health maintenance:  -STD screening: *** -pap smear: *** -mammogram: *** -lipid screening: *** -DEXA: *** -immunizations: *** -advance directives: *** -handout given on health maintenance topics  FOLLOW UP: Follow up in *** for ***  Palma HolterKanishka G Kaedan Richert, MD PGY 2 [facility]

## 2016-10-27 ENCOUNTER — Ambulatory Visit: Payer: Medicaid Other | Admitting: Internal Medicine

## 2016-11-26 ENCOUNTER — Ambulatory Visit: Payer: Medicaid Other | Admitting: Family Medicine

## 2017-01-19 ENCOUNTER — Encounter (HOSPITAL_BASED_OUTPATIENT_CLINIC_OR_DEPARTMENT_OTHER): Payer: Self-pay | Admitting: *Deleted

## 2017-01-19 ENCOUNTER — Emergency Department (HOSPITAL_BASED_OUTPATIENT_CLINIC_OR_DEPARTMENT_OTHER): Payer: No Typology Code available for payment source

## 2017-01-19 ENCOUNTER — Emergency Department (HOSPITAL_BASED_OUTPATIENT_CLINIC_OR_DEPARTMENT_OTHER)
Admission: EM | Admit: 2017-01-19 | Discharge: 2017-01-20 | Disposition: A | Discharge status: Home / Self Care | Payer: No Typology Code available for payment source | Attending: Emergency Medicine | Admitting: Emergency Medicine

## 2017-01-19 DIAGNOSIS — M25511 Pain in right shoulder: Secondary | ICD-10-CM | POA: Diagnosis present

## 2017-01-19 DIAGNOSIS — M7918 Myalgia, other site: Secondary | ICD-10-CM

## 2017-01-19 DIAGNOSIS — Y92410 Unspecified street and highway as the place of occurrence of the external cause: Secondary | ICD-10-CM | POA: Diagnosis not present

## 2017-01-19 DIAGNOSIS — M545 Low back pain: Secondary | ICD-10-CM | POA: Insufficient documentation

## 2017-01-19 DIAGNOSIS — Y9389 Activity, other specified: Secondary | ICD-10-CM | POA: Insufficient documentation

## 2017-01-19 DIAGNOSIS — Y999 Unspecified external cause status: Secondary | ICD-10-CM | POA: Insufficient documentation

## 2017-01-19 DIAGNOSIS — E119 Type 2 diabetes mellitus without complications: Secondary | ICD-10-CM | POA: Insufficient documentation

## 2017-01-19 DIAGNOSIS — M791 Myalgia: Secondary | ICD-10-CM | POA: Diagnosis not present

## 2017-01-19 MED ORDER — ACETAMINOPHEN 325 MG PO TABS
650.0000 mg | ORAL_TABLET | Freq: Once | ORAL | Status: AC
  Administered 2017-01-19: 650 mg via ORAL
  Filled 2017-01-19: qty 2

## 2017-01-19 NOTE — ED Notes (Signed)
Patient transported to X-ray 

## 2017-01-19 NOTE — ED Notes (Signed)
ED Provider at bedside. 
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
## 2017-01-19 NOTE — ED Notes (Signed)
Pt declines urine preg. She will sign waiver.

## 2017-01-20 NOTE — Discharge Instructions (Signed)
X-rays were negative for any broken bones.   For pain control you may take up to (I would advise starting lower then this and gradually increasing):  800mg  of ibuprofen (that is usually 4 over the counter pills)  3 times a day (take with food) and acetaminophen 975mg  (this is 3 over the counter pills) four times a day. Do not drink alcohol or combine with other medications that have acetaminophen as an ingredient (Read the labels!).   I'm writing off work for tomorrow.  As we discussed, your soreness may increase over the next few days. Please follow the below regimen  Please rest these areas. Rest is required to allow your body to heal. This usually involves reducing your normal activities and avoiding use of the injured part of your body. Generally, you can return to your normal activities when you are comfortable and have been given permission by your health care provider.  Please ice your injuries. This helps to keep the swelling down, and it lessens pain. Do not apply ice directly to your skin. Put ice in a plastic bag. Place a towel between your skin and the bag. Leave the ice on for 20 minutes, 2-3 times a day.  Please elevate you arm. Elevation means keeping the injured area raised. This helps to lessen swelling and decrease pain. If possible, your injured area should be elevated at or above the level of your heart or the center of your chest.  Please follow up with your PCP for persistent symptoms. Your blood pressure was elevated during today's visit. Please discuss this with your PCP during your follow-up appointment to determine if a medication adjustment/addition is needed for this.

## 2018-07-27 ENCOUNTER — Encounter (HOSPITAL_COMMUNITY): Payer: Self-pay

## 2018-07-27 ENCOUNTER — Emergency Department (HOSPITAL_COMMUNITY)
Admission: EM | Admit: 2018-07-27 | Discharge: 2018-07-27 | Disposition: A | Discharge status: Home / Self Care | Payer: 59 | Attending: Emergency Medicine | Admitting: Emergency Medicine

## 2018-07-27 ENCOUNTER — Other Ambulatory Visit: Payer: Self-pay

## 2018-07-27 DIAGNOSIS — J069 Acute upper respiratory infection, unspecified: Secondary | ICD-10-CM | POA: Insufficient documentation

## 2018-07-27 DIAGNOSIS — R05 Cough: Secondary | ICD-10-CM | POA: Diagnosis present

## 2018-07-27 DIAGNOSIS — E119 Type 2 diabetes mellitus without complications: Secondary | ICD-10-CM | POA: Insufficient documentation

## 2018-07-27 MED ORDER — AZITHROMYCIN 250 MG PO TABS: 250.0000 mg | ORAL_TABLET | Freq: Every day | ORAL | 0 refills | Status: DC

## 2018-07-27 MED ORDER — FLUTICASONE PROPIONATE 50 MCG/ACT NA SUSP: 1.0000 | Freq: Every day | NASAL | 2 refills | Status: DC

## 2018-07-27 MED ORDER — BENZONATATE 100 MG PO CAPS: 100.0000 mg | ORAL_CAPSULE | Freq: Three times a day (TID) | ORAL | 0 refills | Status: DC

## 2018-07-27 NOTE — Discharge Instructions (Signed)
You can take Tylenol or Ibuprofen as directed for pain. You can alternate Tylenol and Ibuprofen every 4 hours. If you take Tylenol at 1pm, then you can take Ibuprofen at 5pm. Then you can take Tylenol again at 9pm.   Take antibiotics as directed. Please take all of your antibiotics until finished.  Use Flonase as directed for congestion.  Use Tessalon Perles as directed for cough.  Follow-up with [facility] to establish a primary care doctor if you do not have one.   Return the emergency department for high fever despite medications, difficulty breathing, vomiting, any other worsening or concerning symptoms.

## 2018-07-27 NOTE — ED Triage Notes (Signed)
Patient c/o generalized body aches, a nonproductive cough, chills, and nasal congestion since yesterday.

## 2018-07-27 NOTE — ED Provider Notes (Signed)
[facility]-EMERGENCY DEPT Provider Note   CSN: [id number] Arrival date & time: 07/27/18  1805     History   [id number] Complaint Chief Complaint  Patient presents with  . Cough  . Nasal Congestion  . Generalized Body Aches  . Chills    HPI Melissa Pratt is a 43 y.o. female past medical history of diabetes who presents for evaluation of 3 days of cough, nasal congestion, rhinorrhea.  Patient reports that she had had symptoms for about a week prior to most recent episode.  She reports that she took TheraFlu and she had some improvement in symptoms for about a day and then her symptoms returned significantly 3 days ago.  Patient states that she has had trouble breathing out of her nose secondary to congestion.  No shortness of breath.  She states she has had some chills but has not noted any fever.  Patient reports that cough is dry and denies any hemoptysis.  Patient reports some associated chest soreness with coughing.  No other chest pain.  Patient denies any history of asthma or smoking. She denies any OCP use, recent immobilization, prior history of DVT/PE, recent surgery, leg swelling, or long travel.  Patient denies any abdominal pain, nausea/vomiting, diarrhea.  The history is provided by the patient.    Past Medical History:  Diagnosis Date  . Diabetes mellitus without complication (HCC)   . Menorrhagia   . SVD (spontaneous vaginal delivery)    x 3    Patient Active Problem List   Diagnosis Date Noted  . DUB (dysfunctional uterine bleeding) 10/22/2011    Past Surgical History:  Procedure Laterality Date  . NOVASURE ABLATION  01/04/2012   Procedure: NOVASURE ABLATION;  Surgeon: Adam PhenixJames G Arnold, MD;  Location: [facility];  Service: Gynecology;  Laterality: N/A;  . TONSILLECTOMY    . TUBAL LIGATION       OB History    Gravida  3   Para  3   Term  3   Preterm      AB      Living  3     SAB      TAB      Ectopic      Multiple      Live Births  3            Home Medications    Prior to Admission medications   Medication Sig Start Date End Date Taking? Authorizing Provider  ACCU-CHEK SOFTCLIX LANCETS lancets USE AS DIRECTED. 01/18/15   [provider]  azithromycin (ZITHROMAX) 250 MG tablet Take 1 tablet (250 mg total) by mouth daily. Take first 2 tablets together, then 1 every day until finished. 07/27/18   Maxwell CaulLayden, Lindsey A, PA-C  benzonatate (TESSALON) 100 MG capsule Take 1 capsule (100 mg total) by mouth every 8 (eight) hours. 07/27/18   Maxwell CaulLayden, Lindsey A, PA-C  fluticasone (FLONASE) 50 MCG/ACT nasal spray Place 1 spray into both nostrils daily. 07/27/18   Graciella FreerLayden, Lindsey A, PA-C  glucose blood (ACCU-CHEK AVIVA PLUS) test strip USE AS DIRECTED 01/18/15   [provider]    Family History History reviewed. No pertinent family history.  Social History Social History   Tobacco Use  . Smoking status: Never Smoker  . Smokeless tobacco: Never Used  Substance Use Topics  . Alcohol use: No  . Drug use: No     Allergies   Patient has no known allergies.   Review of Systems Review of  Systems  Constitutional: Positive for chills. Negative for fever.  HENT: Positive for congestion and rhinorrhea.   Respiratory: Positive for cough. Negative for shortness of breath.   Cardiovascular: Negative for chest pain and leg swelling.  Gastrointestinal: Negative for abdominal pain, nausea and vomiting.  Neurological: Positive for headaches.  All other systems reviewed and are negative.    Physical Exam Updated Vital Signs BP 129/89   Pulse 97   Temp 98 F (36.7 C) (Oral)   Resp 16   Ht 5\' 3"  (1.6 m)   Wt 95.3 kg   LMP 07/20/2018   SpO2 96%   BMI 37.20 kg/m   Physical Exam Vitals signs and nursing note reviewed.  Constitutional:      Appearance: She is well-developed.  HENT:     Head: Normocephalic and atraumatic.     Nose: Congestion present.     Comments: Edematous and erythematous  nasal turbinates bilaterally.      Mouth/Throat:     Mouth: Mucous membranes are moist.     Pharynx: Oropharynx is clear.     Comments: Posterior oropharynx is clear without any signs of erythema, edema, exudate. Uvula is midline.  No trismus. Airway is patent, phonation is intact. Eyes:     General: No scleral icterus.       Right eye: No discharge.        Left eye: No discharge.     Conjunctiva/sclera: Conjunctivae normal.  Pulmonary:     Effort: Pulmonary effort is normal.     Breath sounds: Normal breath sounds.     Comments: Lungs clear to auscultation bilaterally.  Symmetric chest rise.  No wheezing, rales, rhonchi. Skin:    General: Skin is warm and dry.  Neurological:     Mental Status: She is alert.  Psychiatric:        Speech: Speech normal.        Behavior: Behavior normal.      ED Treatments / Results  Labs (all labs ordered are listed, but only abnormal results are displayed) Labs Reviewed - No data to display  EKG None  Radiology No results found.  Procedures Procedures (including critical care time)  Medications Ordered in ED Medications - No data to display   Initial Impression / Assessment and Plan / ED Course  I have reviewed the triage vital signs and the nursing notes.  Pertinent labs & imaging results that were available during my care of the patient were reviewed by me and considered in my medical decision making (see chart for details).     43 year old female who presents for evaluation of 3 days of cough, nasal congestion, rhinorrhea.  Does report that she had symptoms for about a week prior to onset of symptoms.  She had 1 day where she felt she was getting better and then symptoms returned.  No fevers but has had subjective chills. Patient is afebrile, non-toxic appearing, sitting comfortably on examination table. Vital signs reviewed and stable.  Consider URI.  Low suspicion for influenza given history/physical exam, but even so, she is out  of the 48-hour window that would require treatment.  History/physical exam not concerning for pneumonia./Physical exam not concerning for PE.  Given the symptoms have been ongoing for approximately 10 days at this time, will plan to treat with azithromycin.  Distally, patient given Flonase and Tessalon Perles help for symptomatic relief.  Encourage at home supportive care measures. At this time, patient exhibits no emergent life-threatening condition that require further  evaluation in ED or admission. Patient had ample opportunity for questions and discussion. All patient's questions were answered with full understanding. Strict return precautions discussed. Patient expresses understanding and agreement to plan.    Portions of this note were generated with Scientist, clinical (histocompatibility and immunogenetics). Dictation errors may occur despite best attempts at proofreading.   Final Clinical Impressions(s) / ED Diagnoses   Final diagnoses:  Upper respiratory tract infection, unspecified type    ED Discharge Orders         Ordered    azithromycin (ZITHROMAX) 250 MG tablet  Daily     07/27/18 2010    fluticasone (FLONASE) 50 MCG/ACT nasal spray  Daily     07/27/18 2010    benzonatate (TESSALON) 100 MG capsule  Every 8 hours     07/27/18 2010           Rosana Hoes 07/27/18 2130    Benjiman Core, MD 07/27/18 2220

## 2018-10-07 ENCOUNTER — Other Ambulatory Visit: Payer: Self-pay | Admitting: Physician Assistant

## 2018-10-07 DIAGNOSIS — Z1231 Encounter for screening mammogram for malignant neoplasm of breast: Secondary | ICD-10-CM

## 2018-12-02 ENCOUNTER — Ambulatory Visit: Payer: 59

## 2019-03-27 ENCOUNTER — Other Ambulatory Visit: Payer: Self-pay

## 2019-03-27 DIAGNOSIS — Z20822 Contact with and (suspected) exposure to covid-19: Secondary | ICD-10-CM

## 2019-03-28 LAB — NOVEL CORONAVIRUS, NAA: SARS-CoV-2, NAA: NOT DETECTED

## 2019-04-11 ENCOUNTER — Other Ambulatory Visit: Payer: Self-pay

## 2019-04-11 DIAGNOSIS — Z20822 Contact with and (suspected) exposure to covid-19: Secondary | ICD-10-CM

## 2019-04-16 LAB — CBC WITH DIFFERENTIAL/PLATELET
Basophils Absolute: 0.1 10*3/uL (ref 0.0–0.2)
Basos: 1 %
EOS (ABSOLUTE): 0.2 10*3/uL (ref 0.0–0.4)
Eos: 2 %
Hematocrit: 47.7 % — ABNORMAL HIGH (ref 34.0–46.6)
Hemoglobin: 16.2 g/dL — ABNORMAL HIGH (ref 11.1–15.9)
Immature Grans (Abs): 0 10*3/uL (ref 0.0–0.1)
Immature Granulocytes: 0 %
Lymphocytes Absolute: 2.8 10*3/uL (ref 0.7–3.1)
Lymphs: 28 %
MCH: 30.9 pg (ref 26.6–33.0)
MCHC: 34 g/dL (ref 31.5–35.7)
MCV: 91 fL (ref 79–97)
Monocytes Absolute: 1.3 10*3/uL — ABNORMAL HIGH (ref 0.1–0.9)
Monocytes: 14 %
Neutrophils Absolute: 5.5 10*3/uL (ref 1.4–7.0)
Neutrophils: 55 %
Platelets: 357 10*3/uL (ref 150–450)
RBC: 5.24 x10E6/uL (ref 3.77–5.28)
RDW: 12.5 % (ref 11.7–15.4)
WBC: 9.9 10*3/uL (ref 3.4–10.8)

## 2019-04-16 LAB — BUN+CA+CK+CREAT+GLU+P+AMY+L...
Amylase: 30 U/L — ABNORMAL LOW (ref 31–110)
BUN: 11 mg/dL (ref 6–24)
Calcium: 10.1 mg/dL (ref 8.7–10.2)
Creatinine, Ser: 0.95 mg/dL (ref 0.57–1.00)
GFR calc Af Amer: 84 mL/min/{1.73_m2} (ref >59)
GFR calc non Af Amer: 73 mL/min/{1.73_m2} (ref >59)
Glucose: 77 mg/dL (ref 65–99)
Lipase: 17 U/L (ref 14–72)
Phosphorus: 2.7 mg/dL — ABNORMAL LOW (ref 3.0–4.3)
Total CK: 305 U/L — ABNORMAL HIGH (ref 32–182)

## 2019-04-16 LAB — CT/NG RNA, TMA RECTAL
Chlamydia trachomatis, NAA: NEGATIVE
Neisseria gonorrhoeae, NAA: NEGATIVE

## 2019-04-16 LAB — URINALYSIS, ROUTINE W REFLEX MICROSCOPIC
Bilirubin, UA: NEGATIVE
Glucose, UA: NEGATIVE
Ketones, UA: NEGATIVE
Leukocytes,UA: NEGATIVE
Nitrite, UA: NEGATIVE
Protein,UA: NEGATIVE
RBC, UA: NEGATIVE
Specific Gravity, UA: 1.011 (ref 1.005–1.030)
Urobilinogen, Ur: 0.2 mg/dL (ref 0.2–1.0)
pH, UA: 6 (ref 5.0–7.5)

## 2019-04-16 LAB — NOVEL CORONAVIRUS, NAA: SARS-CoV-2, NAA: NOT DETECTED

## 2019-04-16 LAB — HIV ANTIBODY (ROUTINE TESTING W REFLEX): HIV Screen 4th Generation wRfx: NONREACTIVE

## 2019-04-16 LAB — GC/CHLAMYDIA PROBE AMP
Chlamydia trachomatis, NAA: NEGATIVE
Neisseria Gonorrhoeae by PCR: NEGATIVE

## 2019-04-16 LAB — ALP+ALT+AST+TBILI
ALT: 60 IU/L — ABNORMAL HIGH (ref 0–32)
AST: 33 IU/L (ref 0–40)
Alkaline Phosphatase: 62 IU/L (ref 39–117)
Bilirubin Total: <0.2 mg/dL (ref 0.0–1.2)

## 2019-04-16 LAB — RPR: RPR Ser Ql: NONREACTIVE

## 2019-04-16 LAB — HEPATITIS C ANTIBODY: Hep C Virus Ab: 0.1 s/co ratio (ref 0.0–0.9)

## 2019-09-29 ENCOUNTER — Ambulatory Visit: Payer: 59 | Attending: Internal Medicine

## 2019-09-29 DIAGNOSIS — Z23 Encounter for immunization: Secondary | ICD-10-CM

## 2019-09-29 NOTE — Progress Notes (Signed)
   Covid-19 Vaccination Clinic  Name:  JILLIANNE GAMINO    MRN: [id number] DOB: 06/04/1976  09/29/2019  Ms. Hiraldo was observed post Covid-19 immunization for 15 minutes without incident. She was provided with Vaccine Information Sheet and instruction to access the V-Safe system.   Ms. Kilman was instructed to call 911 with any severe reactions post vaccine: Marland Kitchen Difficulty breathing  . Swelling of face and throat  . A fast heartbeat  . A bad rash all over body  . Dizziness and weakness   Immunizations Administered    Name Date Dose VIS Date Route   Pfizer COVID-19 Vaccine 09/29/2019 12:46 PM 0.3 mL 06/23/2019 Intramuscular   Manufacturer: [employer], [employer]   Lot: [id number]   NDC: [address]-4

## 2019-10-24 ENCOUNTER — Ambulatory Visit: Payer: 59 | Attending: Internal Medicine

## 2019-10-24 DIAGNOSIS — Z23 Encounter for immunization: Secondary | ICD-10-CM

## 2019-10-24 NOTE — Progress Notes (Signed)
   Covid-19 Vaccination Clinic  Name:  Melissa Pratt    MRN: [id number] DOB: [birth year]  10/24/2019  Melissa Pratt was observed post Covid-19 immunization for 15 minutes without incident. She was provided with Vaccine Information Sheet and instruction to access the V-Safe system.   Melissa Pratt was instructed to call 911 with any severe reactions post vaccine: Marland Kitchen Difficulty breathing  . Swelling of face and throat  . A fast heartbeat  . A bad rash all over body  . Dizziness and weakness   Immunizations Administered    Name Date Dose VIS Date Route   Pfizer COVID-19 Vaccine 10/24/2019  3:32 PM 0.3 mL 06/23/2019 Intramuscular   Manufacturer: [employer], [employer]   Lot: [id number]   NDC: [address]-3

## 2020-11-04 ENCOUNTER — Other Ambulatory Visit: Payer: Self-pay | Admitting: Endocrinology

## 2020-11-04 DIAGNOSIS — Z1231 Encounter for screening mammogram for malignant neoplasm of breast: Secondary | ICD-10-CM

## 2020-11-15 ENCOUNTER — Emergency Department (HOSPITAL_BASED_OUTPATIENT_CLINIC_OR_DEPARTMENT_OTHER)
Admission: EM | Admit: 2020-11-15 | Discharge: 2020-11-15 | Disposition: A | Discharge status: Home / Self Care | Payer: 59 | Attending: Emergency Medicine | Admitting: Emergency Medicine

## 2020-11-15 ENCOUNTER — Encounter (HOSPITAL_BASED_OUTPATIENT_CLINIC_OR_DEPARTMENT_OTHER): Payer: Self-pay

## 2020-11-15 ENCOUNTER — Other Ambulatory Visit: Payer: Self-pay

## 2020-11-15 DIAGNOSIS — M545 Low back pain, unspecified: Secondary | ICD-10-CM | POA: Diagnosis not present

## 2020-11-15 DIAGNOSIS — E119 Type 2 diabetes mellitus without complications: Secondary | ICD-10-CM | POA: Diagnosis not present

## 2020-11-15 DIAGNOSIS — S161XXA Strain of muscle, fascia and tendon at neck level, initial encounter: Secondary | ICD-10-CM | POA: Diagnosis not present

## 2020-11-15 DIAGNOSIS — Y92410 Unspecified street and highway as the place of occurrence of the external cause: Secondary | ICD-10-CM | POA: Insufficient documentation

## 2020-11-15 DIAGNOSIS — S199XXA Unspecified injury of neck, initial encounter: Secondary | ICD-10-CM | POA: Diagnosis present

## 2020-11-15 MED ORDER — METHOCARBAMOL 500 MG PO TABS: 1000.0000 mg | ORAL_TABLET | Freq: Four times a day (QID) | ORAL | 0 refills | Status: AC
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
